# Patient Record
Sex: Male | Born: 1935 | Race: White | Hispanic: No | Marital: Married | Smoking: Never smoker
Health system: Southern US, Community
[De-identification: ages and names within clinical notes are randomized; demographics above are authoritative.]

## PROBLEM LIST (undated history)

## (undated) DIAGNOSIS — I4891 Unspecified atrial fibrillation: Secondary | ICD-10-CM

## (undated) DIAGNOSIS — E119 Type 2 diabetes mellitus without complications: Secondary | ICD-10-CM

## (undated) DIAGNOSIS — R0602 Shortness of breath: Secondary | ICD-10-CM

## (undated) DIAGNOSIS — I10 Essential (primary) hypertension: Secondary | ICD-10-CM

## (undated) DIAGNOSIS — I2510 Atherosclerotic heart disease of native coronary artery without angina pectoris: Secondary | ICD-10-CM

## (undated) DIAGNOSIS — N289 Disorder of kidney and ureter, unspecified: Secondary | ICD-10-CM

## (undated) DIAGNOSIS — I499 Cardiac arrhythmia, unspecified: Secondary | ICD-10-CM

## (undated) DIAGNOSIS — E785 Hyperlipidemia, unspecified: Secondary | ICD-10-CM

## (undated) DIAGNOSIS — M1990 Unspecified osteoarthritis, unspecified site: Secondary | ICD-10-CM

## (undated) DIAGNOSIS — I209 Angina pectoris, unspecified: Secondary | ICD-10-CM

## (undated) DIAGNOSIS — I214 Non-ST elevation (NSTEMI) myocardial infarction: Secondary | ICD-10-CM

## (undated) DIAGNOSIS — I739 Peripheral vascular disease, unspecified: Secondary | ICD-10-CM

## (undated) DIAGNOSIS — K219 Gastro-esophageal reflux disease without esophagitis: Secondary | ICD-10-CM

## (undated) DIAGNOSIS — G546 Phantom limb syndrome with pain: Secondary | ICD-10-CM

## (undated) DIAGNOSIS — E8809 Other disorders of plasma-protein metabolism, not elsewhere classified: Secondary | ICD-10-CM

## (undated) DIAGNOSIS — E669 Obesity, unspecified: Secondary | ICD-10-CM

## (undated) DIAGNOSIS — Z9289 Personal history of other medical treatment: Secondary | ICD-10-CM

## (undated) HISTORY — DX: Cardiac arrhythmia, unspecified: I49.9

## (undated) HISTORY — PX: INGUINAL HERNIA REPAIR: SUR1180

## (undated) HISTORY — DX: Atherosclerotic heart disease of native coronary artery without angina pectoris: I25.10

## (undated) HISTORY — DX: Essential (primary) hypertension: I10

## (undated) HISTORY — DX: Obesity, unspecified: E66.9

## (undated) HISTORY — DX: Hyperlipidemia, unspecified: E78.5

## (undated) HISTORY — DX: Other disorders of plasma-protein metabolism, not elsewhere classified: E88.09

## (undated) HISTORY — DX: Disorder of kidney and ureter, unspecified: N28.9

## (undated) HISTORY — PX: JOINT REPLACEMENT: SHX530

## (undated) HISTORY — DX: Peripheral vascular disease, unspecified: I73.9

## (undated) HISTORY — PX: APPENDECTOMY: SHX54

## (undated) HISTORY — PX: CORONARY ANGIOPLASTY: SHX604

## (undated) HISTORY — PX: VASCULAR SURGERY: SHX849

## (undated) HISTORY — DX: Phantom limb syndrome with pain: G54.6

## (undated) HISTORY — PX: CHOLECYSTECTOMY: SHX55

---

## 2000-09-07 ENCOUNTER — Encounter: Admission: RE | Admit: 2000-09-07 | Discharge: 2000-09-07 | Payer: Self-pay | Admitting: Internal Medicine

## 2007-02-14 DIAGNOSIS — I214 Non-ST elevation (NSTEMI) myocardial infarction: Secondary | ICD-10-CM

## 2007-02-14 HISTORY — DX: Non-ST elevation (NSTEMI) myocardial infarction: I21.4

## 2007-03-04 ENCOUNTER — Ambulatory Visit: Payer: Self-pay | Admitting: Cardiology

## 2007-03-04 ENCOUNTER — Inpatient Hospital Stay (HOSPITAL_COMMUNITY): Admission: EM | Admit: 2007-03-04 | Discharge: 2007-03-13 | Payer: Self-pay | Admitting: Cardiovascular Disease

## 2007-03-05 ENCOUNTER — Encounter: Payer: Self-pay | Admitting: Cardiovascular Disease

## 2007-03-06 ENCOUNTER — Encounter: Payer: Self-pay | Admitting: Cardiovascular Disease

## 2007-03-08 ENCOUNTER — Encounter: Payer: Self-pay | Admitting: Cardiothoracic Surgery

## 2007-03-09 ENCOUNTER — Ambulatory Visit: Payer: Self-pay | Admitting: Cardiothoracic Surgery

## 2007-03-10 HISTORY — PX: CORONARY ARTERY BYPASS GRAFT: SHX141

## 2007-04-01 ENCOUNTER — Encounter: Admission: RE | Admit: 2007-04-01 | Discharge: 2007-04-01 | Payer: Self-pay | Admitting: Cardiothoracic Surgery

## 2007-04-01 ENCOUNTER — Ambulatory Visit: Payer: Self-pay | Admitting: Cardiothoracic Surgery

## 2007-04-26 ENCOUNTER — Ambulatory Visit: Payer: Self-pay | Admitting: Cardiology

## 2007-04-29 ENCOUNTER — Encounter: Admission: RE | Admit: 2007-04-29 | Discharge: 2007-04-29 | Payer: Self-pay | Admitting: Cardiothoracic Surgery

## 2007-04-29 ENCOUNTER — Ambulatory Visit: Payer: Self-pay | Admitting: Cardiothoracic Surgery

## 2007-04-30 ENCOUNTER — Ambulatory Visit: Payer: Self-pay | Admitting: Cardiothoracic Surgery

## 2007-05-05 ENCOUNTER — Encounter: Payer: Self-pay | Admitting: Cardiology

## 2007-05-13 ENCOUNTER — Encounter: Payer: Self-pay | Admitting: Cardiology

## 2007-06-18 ENCOUNTER — Ambulatory Visit: Payer: Self-pay | Admitting: Cardiology

## 2007-06-22 ENCOUNTER — Encounter: Payer: Self-pay | Admitting: Cardiology

## 2007-07-01 ENCOUNTER — Ambulatory Visit: Payer: Self-pay | Admitting: Cardiothoracic Surgery

## 2007-07-01 ENCOUNTER — Encounter: Admission: RE | Admit: 2007-07-01 | Discharge: 2007-07-01 | Payer: Self-pay | Admitting: Cardiothoracic Surgery

## 2007-07-07 ENCOUNTER — Encounter: Payer: Self-pay | Admitting: Cardiology

## 2007-07-09 ENCOUNTER — Ambulatory Visit: Payer: Self-pay | Admitting: Cardiology

## 2007-07-30 ENCOUNTER — Ambulatory Visit: Payer: Self-pay

## 2007-08-02 ENCOUNTER — Ambulatory Visit: Payer: Self-pay | Admitting: Cardiovascular Disease

## 2007-12-16 ENCOUNTER — Encounter: Admission: RE | Admit: 2007-12-16 | Discharge: 2007-12-16 | Payer: Self-pay | Admitting: Cardiothoracic Surgery

## 2007-12-16 ENCOUNTER — Ambulatory Visit: Payer: Self-pay | Admitting: Cardiothoracic Surgery

## 2007-12-30 ENCOUNTER — Ambulatory Visit: Payer: Self-pay | Admitting: *Deleted

## 2008-01-21 ENCOUNTER — Ambulatory Visit: Payer: Self-pay | Admitting: *Deleted

## 2008-01-21 ENCOUNTER — Ambulatory Visit (HOSPITAL_COMMUNITY): Admission: RE | Admit: 2008-01-21 | Discharge: 2008-01-21 | Payer: Self-pay | Admitting: *Deleted

## 2008-01-21 ENCOUNTER — Encounter: Payer: Self-pay | Admitting: Cardiology

## 2008-01-27 ENCOUNTER — Ambulatory Visit: Payer: Self-pay | Admitting: *Deleted

## 2008-02-02 ENCOUNTER — Encounter: Payer: Self-pay | Admitting: Cardiology

## 2008-02-04 ENCOUNTER — Inpatient Hospital Stay (HOSPITAL_COMMUNITY): Admission: RE | Admit: 2008-02-04 | Discharge: 2008-02-07 | Payer: Self-pay | Admitting: *Deleted

## 2008-02-04 ENCOUNTER — Encounter (INDEPENDENT_AMBULATORY_CARE_PROVIDER_SITE_OTHER): Payer: Self-pay | Admitting: *Deleted

## 2008-02-04 HISTORY — PX: FEMORAL BYPASS: SHX50

## 2008-02-16 ENCOUNTER — Ambulatory Visit: Payer: Self-pay | Admitting: Cardiology

## 2008-02-16 DIAGNOSIS — I4821 Permanent atrial fibrillation: Secondary | ICD-10-CM

## 2008-02-16 DIAGNOSIS — I739 Peripheral vascular disease, unspecified: Secondary | ICD-10-CM

## 2008-02-16 DIAGNOSIS — E119 Type 2 diabetes mellitus without complications: Secondary | ICD-10-CM

## 2008-02-16 DIAGNOSIS — I25810 Atherosclerosis of coronary artery bypass graft(s) without angina pectoris: Secondary | ICD-10-CM | POA: Insufficient documentation

## 2008-02-16 DIAGNOSIS — I10 Essential (primary) hypertension: Secondary | ICD-10-CM

## 2008-02-19 ENCOUNTER — Encounter: Payer: Self-pay | Admitting: Cardiology

## 2008-02-24 ENCOUNTER — Ambulatory Visit: Payer: Self-pay | Admitting: *Deleted

## 2008-03-23 ENCOUNTER — Encounter: Payer: Self-pay | Admitting: Cardiology

## 2008-04-21 ENCOUNTER — Ambulatory Visit: Payer: Self-pay | Admitting: *Deleted

## 2008-06-22 ENCOUNTER — Encounter: Admission: RE | Admit: 2008-06-22 | Discharge: 2008-06-22 | Payer: Self-pay | Admitting: Cardiothoracic Surgery

## 2008-06-22 ENCOUNTER — Ambulatory Visit: Payer: Self-pay | Admitting: Cardiothoracic Surgery

## 2008-06-22 ENCOUNTER — Encounter: Payer: Self-pay | Admitting: Internal Medicine

## 2008-07-21 ENCOUNTER — Encounter (INDEPENDENT_AMBULATORY_CARE_PROVIDER_SITE_OTHER): Payer: Self-pay | Admitting: *Deleted

## 2008-07-27 ENCOUNTER — Ambulatory Visit: Payer: Self-pay | Admitting: *Deleted

## 2008-08-18 ENCOUNTER — Ambulatory Visit: Payer: Self-pay | Admitting: Vascular Surgery

## 2008-09-12 DIAGNOSIS — E785 Hyperlipidemia, unspecified: Secondary | ICD-10-CM

## 2008-09-12 DIAGNOSIS — Z9889 Other specified postprocedural states: Secondary | ICD-10-CM | POA: Insufficient documentation

## 2008-09-12 DIAGNOSIS — N259 Disorder resulting from impaired renal tubular function, unspecified: Secondary | ICD-10-CM | POA: Insufficient documentation

## 2008-09-12 DIAGNOSIS — Z9089 Acquired absence of other organs: Secondary | ICD-10-CM

## 2008-09-12 DIAGNOSIS — Z951 Presence of aortocoronary bypass graft: Secondary | ICD-10-CM

## 2008-09-20 ENCOUNTER — Ambulatory Visit: Payer: Self-pay | Admitting: Cardiology

## 2008-09-22 ENCOUNTER — Ambulatory Visit: Payer: Self-pay | Admitting: Vascular Surgery

## 2008-09-22 ENCOUNTER — Encounter: Payer: Self-pay | Admitting: Cardiology

## 2008-10-11 IMAGING — CR DG CHEST 2V
2 series · 2 of 2 positions shown · non-contrast
Comparison: [REDACTED] chest x-ray 03/11/07 and 03/07/07, and [REDACTED], 03/07/07.

CLINICAL DATA: Post CABG, 03/08/07.  Cough.  Shortness of breath. 
 DIAGNOSTIC CHEST - 2 VIEW:

[w chest pa]
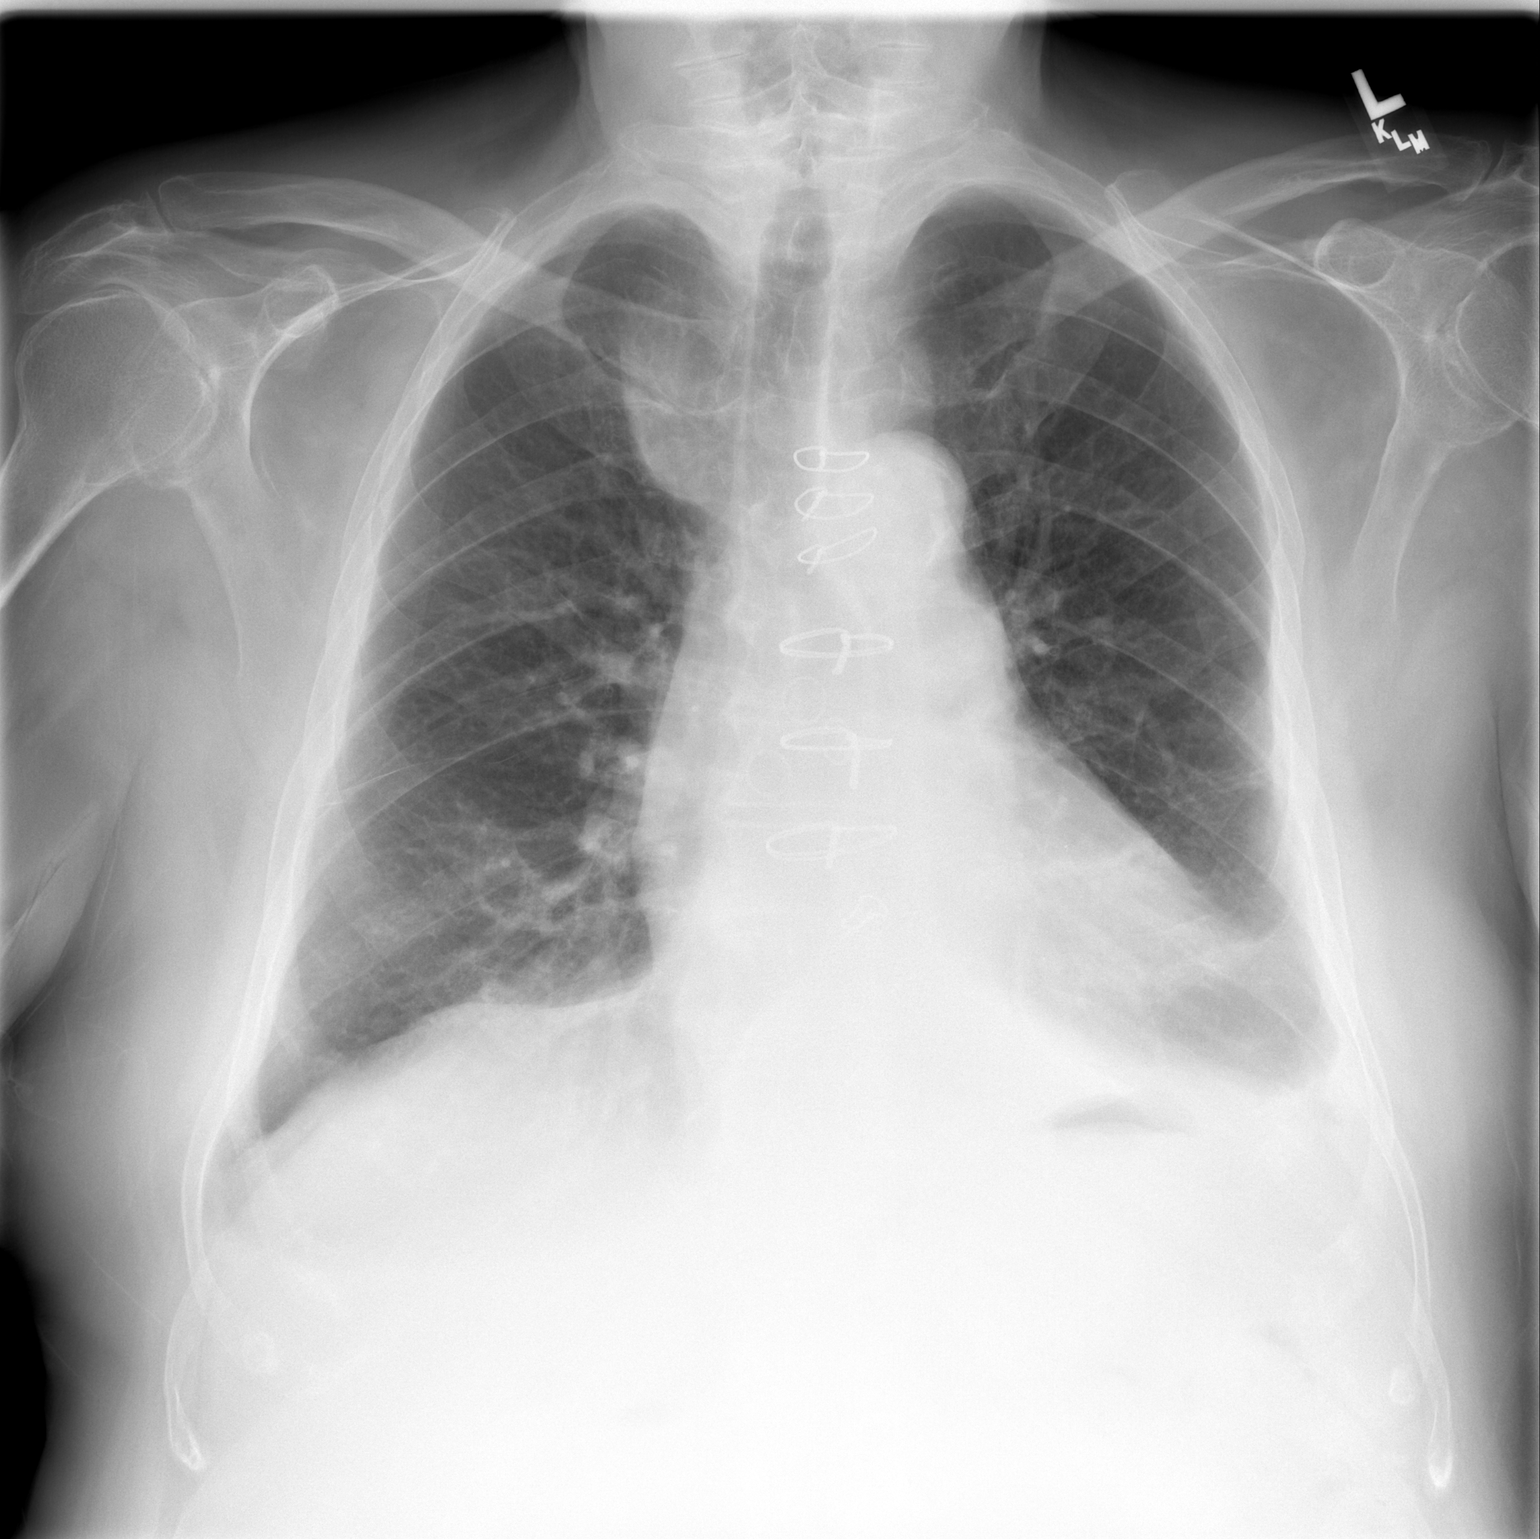

[w chest lat]
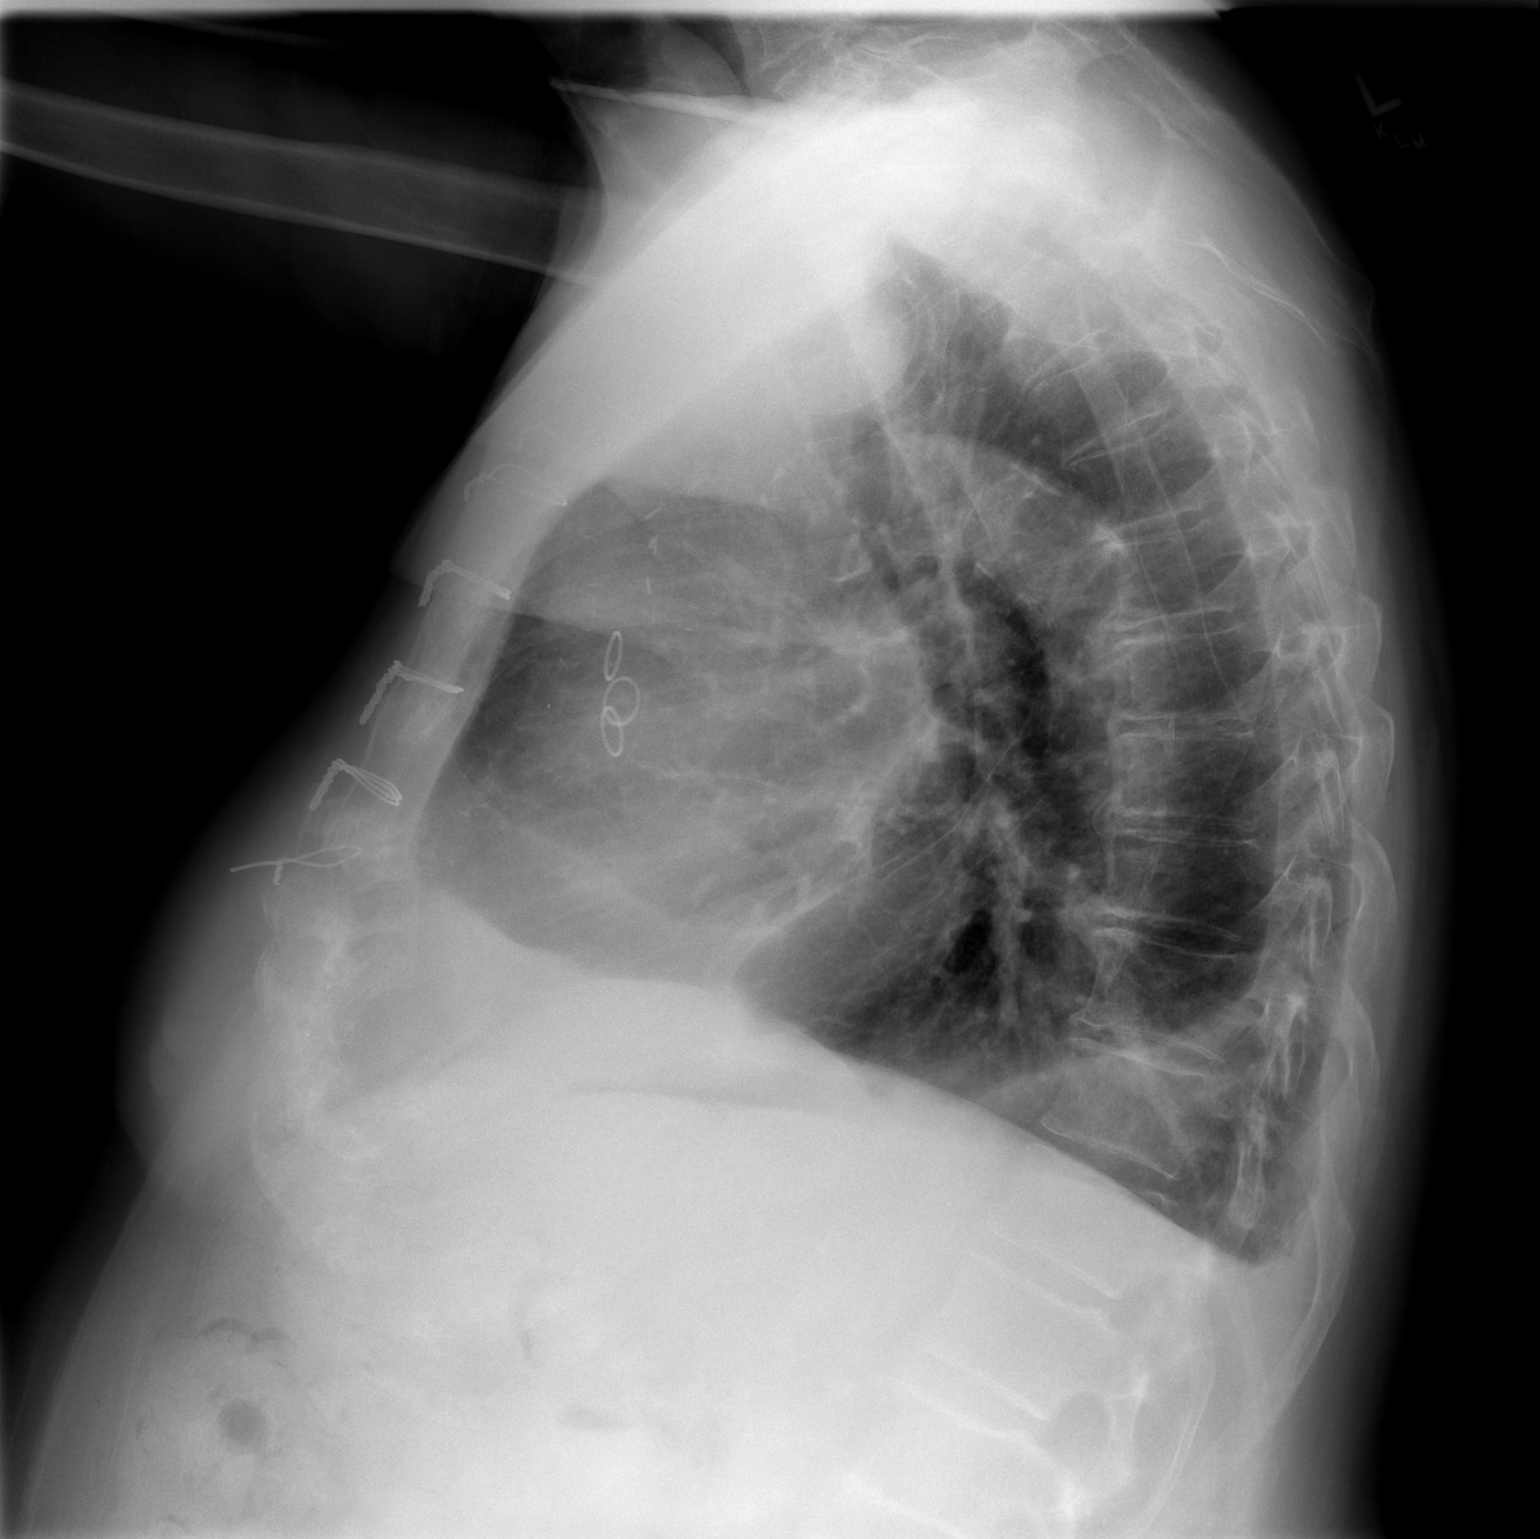

[2 of 2 positions shown; findings below may reference images not displayed]

FINDINGS: Stable right superior mediastinal shadow corresponds to dolichoectasia brachiocephalic artery.  Stable slight cardiomegaly.  Post CABG change is seen.  Persistent slightly smaller (left greater than right) small pleural effusions are seen.  Chronic linear scarring or atelectasis is seen at the left lower lobe.  The lungs are otherwise clear.  No new abnormality is seen.
IMPRESSION: 1.  Smaller yet persistent (left greater than right) small pleural effusion.
 2.  Slight cardiomegaly post CABG.
 3.  Dolichoectasia right brachiocephalic artery. 
 4.  No new abnormality seen.

## 2009-03-28 ENCOUNTER — Ambulatory Visit: Payer: Self-pay | Admitting: Cardiology

## 2009-03-28 DIAGNOSIS — S2329XA Dislocation of other parts of thorax, initial encounter: Secondary | ICD-10-CM | POA: Insufficient documentation

## 2009-04-23 ENCOUNTER — Encounter (INDEPENDENT_AMBULATORY_CARE_PROVIDER_SITE_OTHER): Payer: Self-pay | Admitting: *Deleted

## 2009-05-29 ENCOUNTER — Telehealth (INDEPENDENT_AMBULATORY_CARE_PROVIDER_SITE_OTHER): Payer: Self-pay | Admitting: *Deleted

## 2009-05-29 ENCOUNTER — Encounter: Payer: Self-pay | Admitting: Cardiology

## 2009-06-06 ENCOUNTER — Encounter: Payer: Self-pay | Admitting: Cardiology

## 2009-06-26 ENCOUNTER — Encounter: Payer: Self-pay | Admitting: Cardiology

## 2009-07-01 ENCOUNTER — Ambulatory Visit: Payer: Self-pay | Admitting: Cardiology

## 2009-07-02 ENCOUNTER — Encounter: Payer: Self-pay | Admitting: Cardiology

## 2009-07-03 ENCOUNTER — Encounter: Payer: Self-pay | Admitting: Cardiology

## 2009-07-04 ENCOUNTER — Encounter: Payer: Self-pay | Admitting: Cardiology

## 2009-07-05 ENCOUNTER — Encounter: Payer: Self-pay | Admitting: Cardiology

## 2009-07-11 ENCOUNTER — Encounter: Payer: Self-pay | Admitting: Cardiology

## 2009-07-19 ENCOUNTER — Encounter: Payer: Self-pay | Admitting: Cardiology

## 2009-08-03 ENCOUNTER — Encounter: Payer: Self-pay | Admitting: Cardiology

## 2009-08-03 ENCOUNTER — Ambulatory Visit: Payer: Self-pay | Admitting: Vascular Surgery

## 2009-08-08 ENCOUNTER — Ambulatory Visit: Payer: Self-pay | Admitting: Cardiology

## 2009-08-08 DIAGNOSIS — R0602 Shortness of breath: Secondary | ICD-10-CM

## 2009-08-10 ENCOUNTER — Ambulatory Visit: Payer: Self-pay | Admitting: Cardiology

## 2009-08-14 ENCOUNTER — Ambulatory Visit: Payer: Self-pay | Admitting: Cardiology

## 2009-08-14 LAB — CONVERTED CEMR LAB: POC INR: 3.2

## 2009-08-15 ENCOUNTER — Encounter: Payer: Self-pay | Admitting: Cardiology

## 2009-08-24 ENCOUNTER — Encounter: Payer: Self-pay | Admitting: Cardiology

## 2009-08-24 ENCOUNTER — Ambulatory Visit: Payer: Self-pay | Admitting: Cardiology

## 2009-08-24 LAB — CONVERTED CEMR LAB
INR: 8.1
POC INR: >8
Prothrombin Time: 70.1 s

## 2009-08-28 ENCOUNTER — Ambulatory Visit: Payer: Self-pay | Admitting: Cardiology

## 2009-08-28 LAB — CONVERTED CEMR LAB: POC INR: 2.6

## 2009-08-31 ENCOUNTER — Ambulatory Visit: Payer: Self-pay | Admitting: Cardiology

## 2009-08-31 LAB — CONVERTED CEMR LAB: POC INR: 1.8

## 2009-09-04 ENCOUNTER — Ambulatory Visit: Payer: Self-pay | Admitting: Cardiology

## 2009-09-12 ENCOUNTER — Encounter: Payer: Self-pay | Admitting: Cardiology

## 2009-09-13 ENCOUNTER — Encounter: Payer: Self-pay | Admitting: Cardiology

## 2009-09-14 ENCOUNTER — Ambulatory Visit: Payer: Self-pay | Admitting: Cardiology

## 2009-09-25 ENCOUNTER — Telehealth (INDEPENDENT_AMBULATORY_CARE_PROVIDER_SITE_OTHER): Payer: Self-pay | Admitting: *Deleted

## 2009-10-05 ENCOUNTER — Ambulatory Visit: Payer: Self-pay | Admitting: Cardiology

## 2009-10-05 LAB — CONVERTED CEMR LAB: POC INR: 1.7

## 2010-01-13 HISTORY — PX: TOTAL HIP ARTHROPLASTY: SHX124

## 2010-02-12 NOTE — Letter (Signed)
Summary: [facility] D/C DR. DHRUV VYAS  [facility] D/C DR. DHRUV VYAS   Imported By: Zachary George 08/08/2009 13:14:07  _____________________________________________________________________  External Attachment:    Type:   Image     Comment:   External Document

## 2010-02-12 NOTE — Medication Information (Signed)
Summary: ccr-lr  Anticoagulant Therapy  Managed by: Vashti Hey, RN PCP: Dr. Doreen Beam Supervising MD: Andee Lineman MD, Michelle Piper Indication 1: Atrial Fibrillation Lab Used: [facility] [place] Site: [facility] INR POC 1.8  Dietary changes: no    Health status changes: no    Bleeding/hemorrhagic complications: no    Recent/future hospitalizations: no    Any changes in medication regimen? no    Recent/future dental: no  Any missed doses?: no       Is patient compliant with meds? yes       Allergies: 1)  ! Ace Inhibitors 2)  ! * Arb  Anticoagulation Management History:      The patient is taking warfarin and comes in today for a routine follow up visit.  He is being anticoagulated due to Atrial Fibrillation .  Positive risk factors for bleeding include an age of 31 years or older, history of CVA/TIA, and presence of serious comorbidities.  Negative risk factors for bleeding include no history of GI bleeding.  The bleeding index is 'high risk'.  Positive CHADS2 values include History of HTN, Age > 75 years old, History of Diabetes, and Prior Stroke/CVA/TIA.  Negative CHADS2 values include History of CHF.  The start date was 08/08/2009.  His last INR was 8.1.  Anticoagulation responsible provider: Andee Lineman MD, Michelle Piper.  INR POC: 1.8.  Cuvette Lot#: [id number].    Anticoagulation Management Assessment/Plan:      The patient's current anticoagulation dose is Coumadin 2.5 mg tabs: take one tab every evening per coumadin clinic.  The target INR is 2.0-3.0.  The next INR is due 09/04/2009.  Anticoagulation instructions were given to patient.  Results were reviewed/authorized by Vashti Hey, RN.  He was notified by Vashti Hey RN.         Prior Anticoagulation Instructions: INR 2.6 Take 1.25mg  x 3 days then recheck INR on 08/31/09   Current Anticoagulation Instructions: INR 1.8 Take coumadin 2.5mg  tonight then resume 1.25mg  once daily

## 2010-02-12 NOTE — Letter (Signed)
Summary: Engineer, materials at [facility]  [address]   [place], [place] [address]   Phone: [phone]  Fax: [phone]        April 23, 2009 MRN: [id number]   [name] Love [address] [place], [place]  [address]   Dear [name],  Your test ordered by Selena Batten has been reviewed by your physician (or physician assistant) and was found to be normal or stable. Your physician (or physician assistant) felt no changes were needed at this time.  ____ Echocardiogram  ____ Cardiac Stress Test  ____ Lab Work  ____ Peripheral vascular study of arms, legs or neck  __X__ CT scan or X-ray (chest) No change from prior x-rays.  ____ Lung or Breathing test  ____ Other:   Thank you.   Hoover Brunette, LPN    Duane Boston, M.D., F.A.C.C. Thressa Sheller, M.D., F.A.C.C. Oneal Grout, M.D., F.A.C.C. Cheree Ditto, M.D., F.A.C.C. Daiva Nakayama, M.D., F.A.C.C. Kenney Houseman, M.D., F.A.C.C. Jeanne Ivan, PA-C

## 2010-02-12 NOTE — Medication Information (Signed)
Summary: ccr-lr  Anticoagulant Therapy  Managed by: Vashti Hey, RN PCP: Dr. Doreen Beam Supervising MD: Myrtis Ser MD, Tinnie Gens Indication 1: Atrial Fibrillation Lab Used: [facility] [place] Site: [facility] PT 70.1 INR POC >8.0  Dietary changes: no    Health status changes: yes       Details: States being sent to kidney MD  Bleeding/hemorrhagic complications: no    Recent/future hospitalizations: no    Any changes in medication regimen? yes       Details: Finshed ABX for toe yesterday     Recent/future dental: no  Any missed doses?: no       Is patient compliant with meds? yes       Allergies: 1)  ! Ace Inhibitors 2)  ! * Arb  Anticoagulation Management History:      The patient is taking warfarin and comes in today for a routine follow up visit.  He is being anticoagulated due to Atrial Fibrillation .  Positive risk factors for bleeding include an age of 79 years or older, history of CVA/TIA, and presence of serious comorbidities.  Negative risk factors for bleeding include no history of GI bleeding.  The bleeding index is 'high risk'.  Positive CHADS2 values include History of HTN, Age > 73 years old, History of Diabetes, and Prior Stroke/CVA/TIA.  Negative CHADS2 values include History of CHF.  The start date was 08/08/2009.  Today's INR is 8.1.  Prothrombin time is 70.1.  Anticoagulation responsible provider: Myrtis Ser MD, Tinnie Gens.  INR POC: >8.0.  Cuvette Lot#: [id number].    Anticoagulation Management Assessment/Plan:      The patient's current anticoagulation dose is Coumadin 2.5 mg tabs: take one tab every evening per coumadin clinic.  The target INR is 2.0-3.0.  The next INR is due 08/28/2009.  Anticoagulation instructions were given to patient.  Results were reviewed/authorized by Vashti Hey, RN.  He was notified by Vashti Hey RN.         Prior Anticoagulation Instructions: INR 3.2 Hold coumadin tonight then take 1 tablet once daily   Current Anticoagulation  Instructions: INR POC > 8.0 Sent to [facility] veinap[facility]  PT 70.1  INR 8.1 Discussed with Weston Brass PharmD Spoke with wife.  Pt to hold coumadin and recheck INR on 08/28/09 Bleeding precautions discussed with wife.  PT to go to ED for any S/S of bleeding.

## 2010-02-12 NOTE — Letter (Signed)
Summary: External Correspondence/ FAXED [facility]  External Correspondence/ FAXED [facility]   Imported By: Dorise Hiss 08/31/2009 10:28:58  _____________________________________________________________________  External Attachment:    Type:   Image     Comment:   External Document

## 2010-02-12 NOTE — Assessment & Plan Note (Signed)
Summary: 6 mo fu per march reminders-srs   Visit Type:  Follow-up Primary Provider:  Dr. Doreen Beam  CC:  follow-up visit.  History of Present Illness: the patient is a 77 year old male with history of coronary artery disease, status post coronary bypass grafting as well as peripheral vascular disease. The patient is status post right femoral posterior tibial artery vascular surgery.the patient has had bypass graft occlusion. This is followed by Dr. Karlene Lineman. The patient has no resting claudication. He has no critical limb ischemia. If anything his symptoms of right leg pain had improved. This  The patient denies any chest pain shortness of breath or copy or PND. He reports no palpitations. He did point to the fact that his sternum appear somewhat loose but prior sternotomy site. He reports no pain or shortness of breath related to this.  the patient walks on a regular basis. He denies any presyncope or syncope.  Preventive Screening-Counseling & Management  Alcohol-Tobacco     Smoking Status: never  Clinical Review Panels:  Cardiac Imaging Cardiac Cath Findings well-preserved left ventricle function Patent internal mammary artery. Diffuse calcified disease of the LAD over a long area #1 circumflex with high-grade first marginal disease and moderate AV circumflex disease Nondominant right coronary artery with 60-70% narrowing. (03/05/2007)    Current Medications (verified): 1)  Glucophage 500 Mg Tabs (Metformin Hcl) .... Take 1 Tablet By Mouth Three Times A Day 2)  Metoprolol Succinate 100 Mg Xr24h-Tab (Metoprolol Succinate) .... One Tablet P.o. Q. Day 3)  Aspirin 81 Mg Tbec (Aspirin) .... Take One Tablet By Mouth Two Times A Day 4)  Simvastatin 40 Mg Tabs (Simvastatin) .... Take 1 Tab By Mouth At Bedtime 5)  Clonidine Hcl 0.2 Mg Tabs (Clonidine Hcl) .... Take 1 Tablet By Mouth Twice A Day 6)  Fish Oil 1000 Mg Caps (Omega-3 Fatty Acids) .... Take 1 Capsule By Mouth Two Times A  Day 7)  Plavix 75 Mg Tabs (Clopidogrel Bisulfate) .... Take One Tablet By Mouth Daily  Allergies: 1)  ! Ace Inhibitors 2)  ! * Arb  Comments:  Nurse/Medical Assistant: The patient's medications were reviewed with the patient and were updated in the Medication List. Pt brought a list of medications to office visit.  Cyril Loosen, RN, BSN (March 28, 2009 9:27 AM)  Past History:  Past Medical History: Last updated: 09/20/2008  1. Multivessel coronary artery disease.       a.     Status post non-ST-segment myocardial infarction/four-vessel        coronary artery bypass graft, February 2009.       b.     Normal left ventricular function.       c.     Postop paroxysmal atrial fibrillation, treated with        amiodarone.   2. Peripheral vascular disease.       a.     Preoperative ankle-brachial indices:  0.66 right; 0.6 left.       b.     Right lower extremity claudication.       c.     40-60% left internal carotid artery stenosis.   3. Dyslipidemia.   4. Type 2 diabetes mellitus.   5. Hypertension.   6. Renal insufficiency.   7. Hypoalbuminemia.   8. Obesity. Lungs pending essential hypertension  Nephrolithiasis Inguinal hernia repair Dyslipidemia ATRIAL FIBRILLATION, HX OF (ICD-V12.59) ESSENTIAL HYPERTENSION, BENIGN (ICD-401.1) AODM (ICD-250.00) PVD (ICD-443.9) CORONARY ATHEROSCLEROSIS, ARTERY BYPASS GRAFT (ICD-414.04)    Past Surgical  History: Last updated: 09/12/2008 CORONARY ARTERY BYPASS GRAFT, HX OF (ICD-V45.81) INGUINAL HERNIORRHAPHY, HX OF (ICD-V45.89) CHOLECYSTECTOMY, HX OF (ICD-V45.79) APPENDECTOMY, HX OF (ICD-V45.79) PVD" Hayes right femto PT bypass with gortex 02/04/2008 occluded by doppler 08/2008  Family History: Last updated: 02/16/2008 there is no peripheral arterial disease or stroke in the family  Social History: Last updated: 02/16/2008 the patient is married. He is to grandchildren. 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 He does not smoke cigarettes or drink alcohol. He exercises regularly.  Risk Factors: Smoking Status: never (03/28/2009)  Review of Systems  The patient denies fatigue, malaise, fever, weight gain/loss, vision loss, decreased hearing, hoarseness, chest pain, palpitations, shortness of breath, prolonged cough, wheezing, sleep apnea, coughing up blood, abdominal pain, blood in stool, nausea, vomiting, diarrhea, heartburn, incontinence, blood in urine, muscle weakness, joint pain, leg swelling, rash, skin lesions, headache, fainting, dizziness, depression, anxiety, enlarged lymph nodes, easy bruising or bleeding, and environmental allergies.    Vital Signs:  Patient profile:   77 year old male Height:      73 inches Weight:      203.50 pounds Pulse rate:   69 / minute BP sitting:   183 / 82  (right arm) Cuff size:   regular  Vitals Entered By: Cyril Loosen, RN, BSN (March 28, 2009 9:23 AM) CC: follow-up visit Comments No cardiac complaints   Physical Exam  Additional Exam:  General: Well-developed, well-nourished in no distress head: Normocephalic and atraumatic eyes PERRLA/EOMI intact, conjunctiva and lids normal nose: No deformity or lesions mouth normal dentition, normal posterior pharynx neck: Supple, no JVD.  No masses, thyromegaly or abnormal cervical nodes lungs: Normal breath sounds bilaterally without wheezing.  Normal percussion Chest: There is a separation of the sternum, without a click on palpation but with independent motion of both sides of the sternum. heart: regular rate and rhythm with normal S1 and S2, no S3 or S4.  PMI is normal.  No pathological murmurs abdomen: Normal bowel sounds, abdomen is soft and nontender without masses, organomegaly or hernias noted.  No hepatosplenomegaly musculoskeletal: Back normal, normal gait muscle strength and tone normal pulsus: unable to palpate pulses in the right leg including dorsalis pedis and  posterior tibial pulse. Extremities: No peripheral pitting edema neurologic: Alert and oriented x 3 skin: Intact without lesions or rashes cervical nodes: No significant adenopathy psychologic: Normal affect    Impression & Recommendations:  Problem # 1:  CLOSED DISLOCATION, STERNUM (ICD-839.61) the patient's sternum is unstable at the prior sternotomy site. I ordered a chest x-ray. I will further discuss this with CVTS after review of the chest x-ray. Orders: T-Chest x-ray, 2 views (71020)  Problem # 2:  CORONARY ARTERY BYPASS GRAFT, HX OF (ICD-V45.81) the patient reports no chest pain. The patient status post bypass grafting in 2009.  Problem # 3:  PVD (ICD-443.9) the patient's significant peripheral vascular disease with occlusion of the graft to his right leg.Dr. early is no planning on any further intervention. The patient was encouraged to increase exercise. His claudication is actually improved.  Problem # 4:  ESSENTIAL HYPERTENSION, BENIGN (ICD-401.1) Assessment: Comment Only  His updated medication list for this problem includes:    Metoprolol Succinate 100 Mg Xr24h-tab (Metoprolol succinate) ..... One tablet p.o. q. day    Aspirin 81 Mg Tbec (Aspirin) .Marland Kitchen... Take one tablet by mouth two times a day    Clonidine Hcl 0.2 Mg Tabs (Clonidine hcl) .Marland Kitchen... Take  1 tablet by mouth twice a day  Other Orders: EKG w/ Interpretation (93000)  Patient Instructions: 1)  chest x-ray 2)  Follow up in  1 year.

## 2010-02-12 NOTE — Progress Notes (Signed)
Summary: MEDICINE QUESTION  Phone Note Call from Patient Call back at Home Phone [phone]   Caller: PATIENT WIFE Reason for Call: Talk to Nurse Summary of Call: PATIENT DID NOT TAKE CORRECT AMOUNT LAST NIGHT.  NEEDS TO KNOW WHAT SHE NEEDS TO DO Initial call taken by: Claudette Laws,  September 25, 2009 9:18 AM  Follow-up for Phone Call        Pt has been taking coumadin 1.25mg  once daily.  He missed last nights dose.  Told wife to have pt take 2.5mg  tonight then resume 1.25mg  once daily.  She verbalized understanding.

## 2010-02-12 NOTE — Medication Information (Signed)
Summary: ccr-lr  Anticoagulant Therapy  Managed by: Vashti Hey, RN PCP: Dr. Doreen Beam Supervising MD: Diona Browner MD, Remi Deter Indication 1: Atrial Fibrillation Lab Used: [facility] [place] Site: [facility] INR POC 2.5  Dietary changes: no    Health status changes: no    Bleeding/hemorrhagic complications: no    Recent/future hospitalizations: no    Any changes in medication regimen? no    Recent/future dental: no  Any missed doses?: no       Is patient compliant with meds? yes       Allergies: 1)  ! Ace Inhibitors 2)  ! * Arb  Anticoagulation Management History:      The patient is taking warfarin and comes in today for a routine follow up visit.  He is being anticoagulated due to Atrial Fibrillation .  Positive risk factors for bleeding include an age of 45 years or older, history of CVA/TIA, and presence of serious comorbidities.  Negative risk factors for bleeding include no history of GI bleeding.  The bleeding index is 'high risk'.  Positive CHADS2 values include History of HTN, Age > 58 years old, History of Diabetes, and Prior Stroke/CVA/TIA.  Negative CHADS2 values include History of CHF.  The start date was 08/08/2009.  His last INR was 8.1.  Anticoagulation responsible provider: Diona Browner MD, Remi Deter.  INR POC: 2.5.  Cuvette Lot#: [id number].    Anticoagulation Management Assessment/Plan:      The patient's current anticoagulation dose is Coumadin 2.5 mg tabs: take one tab every evening per coumadin clinic.  The target INR is 2.0-3.0.  The next INR is due 10/05/2009.  Anticoagulation instructions were given to patient.  Results were reviewed/authorized by Vashti Hey, RN.  He was notified by Vashti Hey RN.         Prior Anticoagulation Instructions: INR 2.6 Continue coumadin 1.25mg  once daily   Current Anticoagulation Instructions: INR 2.5 Continue coumadin 1.25mg  once daily

## 2010-02-12 NOTE — Letter (Signed)
Summary: External Correspondence/ OFFICE VISIT [facility]  External Correspondence/ [facility]ICE [facility]   Imported By: Dorise Hiss 07/03/2009 11:50:00  _____________________________________________________________________  External Attachment:    Type:   Image     Comment:   External Document

## 2010-02-12 NOTE — Medication Information (Signed)
Summary: ccr-lr  Anticoagulant Therapy  Managed by: Vashti Hey, RN PCP: Dr. Doreen Beam Supervising MD: Antoine Poche MD, Fayrene Fearing Indication 1: Atrial Fibrillation Lab Used: [facility] [place] Site: [facility] INR POC 2.6  Dietary changes: no    Health status changes: no    Bleeding/hemorrhagic complications: no    Recent/future hospitalizations: no    Any changes in medication regimen? no    Recent/future dental: no  Any missed doses?: yes     Details: coumadin has been on hold since 08/24/09  Is patient compliant with meds? yes       Allergies: 1)  ! Ace Inhibitors 2)  ! * Arb  Anticoagulation Management History:      The patient is taking warfarin and comes in today for a routine follow up visit.  He is being anticoagulated due to Atrial Fibrillation .  Positive risk factors for bleeding include an age of 35 years or older, history of CVA/TIA, and presence of serious comorbidities.  Negative risk factors for bleeding include no history of GI bleeding.  The bleeding index is 'high risk'.  Positive CHADS2 values include History of HTN, Age > 28 years old, History of Diabetes, and Prior Stroke/CVA/TIA.  Negative CHADS2 values include History of CHF.  The start date was 08/08/2009.  His last INR was 8.1.  Anticoagulation responsible provider: Antoine Poche MD, Fayrene Fearing.  INR POC: 2.6.  Cuvette Lot#: [id number].    Anticoagulation Management Assessment/Plan:      The patient's current anticoagulation dose is Coumadin 2.5 mg tabs: take one tab every evening per coumadin clinic.  The target INR is 2.0-3.0.  The next INR is due 08/31/2009.  Anticoagulation instructions were given to patient.  Results were reviewed/authorized by Vashti Hey, RN.  He was notified by Vashti Hey RN.         Prior Anticoagulation Instructions: INR POC > 8.0 Sent to [facility] veinapuncture  PT 70.1  INR 8.1 Discussed with Weston Brass PharmD Spoke with wife.  Pt to hold coumadin and recheck INR on 08/28/09 Bleeding  precautions discussed with wife.  PT to go to ED for any S/S of bleeding.  Current Anticoagulation Instructions: INR 2.6 Take 1.25mg  x 3 days then recheck INR on 08/31/09

## 2010-02-12 NOTE — Medication Information (Signed)
Summary: RX Folder/ [facility]ICATIONS [facility]  RX Folder/ [facility]   Imported By: Dorise Hiss 07/03/2009 11:51:41  _____________________________________________________________________  External Attachment:    Type:   Image     Comment:   External Document

## 2010-02-12 NOTE — Consult Note (Signed)
Summary: CARDIOLOGY CONSULT/ [facility]  CARDIOLOGY CONSULT/ [facility]   Imported By: Zachary George 08/08/2009 13:11:28  _____________________________________________________________________  External Attachment:    Type:   Image     Comment:   External Document

## 2010-02-12 NOTE — Letter (Signed)
Summary: External Correspondence/ LETTER & OFFICE VISIT [facility]  External Correspondence/ LETTER & OFFICE VISIT [facility]   Imported By: Dorise Hiss 10/02/2009 11:21:53  _____________________________________________________________________  External Attachment:    Type:   Image     Comment:   External Document

## 2010-02-12 NOTE — Medication Information (Signed)
Summary: new ccr - start on 7/27  --agh  Anticoagulant Therapy  Managed by: Vashti Hey, RN PCP: Dr. Doreen Beam Supervising MD: Diona Browner MD, Remi Deter Indication 1: Atrial Fibrillation Lab Used: [facility] [place] Site: [facility] INR POC 1.2  Dietary changes: no    Health status changes: no    Bleeding/hemorrhagic complications: no    Recent/future hospitalizations: yes       Details: post op atrial fib  Any changes in medication regimen? yes       Details: Started on coumadin 2.5mg  qd at [facility] on 08/08/09  Has 2.5mg  tablet  Has had 2 doses  Is on Cipro for toe surgery  Recent/future dental: no  Any missed doses?: no       Is patient compliant with meds? yes      Comments: Coumadin risks and benefits discussed with pt/wife.  Education booklet given.  Discussed foof/drug interactions, dosing schedule and appt compliacnce.  They verbalized understanding.  Allergies: 1)  ! Ace Inhibitors 2)  ! * Arb  Anticoagulation Management History:      The patient is taking warfarin and comes in today for a routine follow up visit.  He is being anticoagulated due to Atrial Fibrillation .  Positive risk factors for bleeding include an age of 75 years or older, history of CVA/TIA, and presence of serious comorbidities.  Negative risk factors for bleeding include no history of GI bleeding.  The bleeding index is 'high risk'.  Positive CHADS2 values include History of HTN, Age > 75 years old, History of Diabetes, and Prior Stroke/CVA/TIA.  Negative CHADS2 values include History of CHF.  The start date was 08/08/2009.  Anticoagulation responsible provider: Diona Browner MD, Remi Deter.  INR POC: 1.2.  Cuvette Lot#: [id number].    Anticoagulation Management Assessment/Plan:      The patient's current anticoagulation dose is Coumadin 2.5 mg tabs: take one tab every evening per coumadin clinic.  The target INR is 2.0-3.0.  The next INR is due 08/14/2009.  Anticoagulation instructions were given to patient.   Results were reviewed/authorized by Vashti Hey, RN.  He was notified by Vashti Hey RN.        Coagulation management information includes: Not pending DCCV at this time.  Current Anticoagulation Instructions: INR 1.2 Increase coumadin to 2.5mg  once daily except 5mg  on M,F

## 2010-02-12 NOTE — Progress Notes (Signed)
Summary: SOB  Phone Note Call from Patient Call back at [phone]   Caller: son - Daryl  Summary of Call: Son called due to dad c/o having some SOB recently over last couple of days.  Not sleeping good at night due to this.  Advised him that GD is out of town and he should call PMD to be seen in next couple of days to be evaluated.  If unable to see him in next few days, call us back and we can send note to GD and have him advise [place] on what to do next.  Son verbalized understanding.   Initial call taken by: Hoover Brunette, LPN,  May 29, 2009 10:09 AM  Follow-up for Phone Call       Follow-up by: Lewayne Bunting, MD, [facility],  May 31, 2009 1:26 PM

## 2010-02-12 NOTE — Medication Information (Signed)
Summary: ccr-lr  Anticoagulant Therapy  Managed by: Vashti Hey, RN PCP: Dr. Doreen Beam Supervising MD: Andee Lineman MD, Michelle Piper Indication 1: Atrial Fibrillation Lab Used: [facility] [place] Site: [place] INR POC 3.2  Dietary changes: no    Health status changes: no    Bleeding/hemorrhagic complications: no    Recent/future hospitalizations: no    Any changes in medication regimen? no    Recent/future dental: no  Any missed doses?: no       Is patient compliant with meds? yes       Allergies: 1)  ! Ace Inhibitors 2)  ! * Arb  Anticoagulation Management History:      The patient is taking warfarin and comes in today for a routine follow up visit.  He is being anticoagulated due to Atrial Fibrillation .  Positive risk factors for bleeding include an age of 35 years or older, history of CVA/TIA, and presence of serious comorbidities.  Negative risk factors for bleeding include no history of GI bleeding.  The bleeding index is 'high risk'.  Positive CHADS2 values include History of HTN, Age > 75 years old, History of Diabetes, and Prior Stroke/CVA/TIA.  Negative CHADS2 values include History of CHF.  The start date was 08/08/2009.  Anticoagulation responsible Dilyn Osoria: Andee Lineman MD, Michelle Piper.  INR POC: 3.2.  Cuvette Lot#: [id number].    Anticoagulation Management Assessment/Plan:      The patient's current anticoagulation dose is Coumadin 2.5 mg tabs: take one tab every evening per coumadin clinic.  The target INR is 2.0-3.0.  The next INR is due 08/24/2009.  Anticoagulation instructions were given to patient.  Results were reviewed/authorized by Vashti Hey, RN.  He was notified by Vashti Hey RN.         Prior Anticoagulation Instructions: INR 1.2 Increase coumadin to 2.5mg  once daily except 5mg  on M,F  Current Anticoagulation Instructions: INR 3.2 Hold coumadin tonight then take 1 tablet once daily

## 2010-02-12 NOTE — Miscellaneous (Signed)
Summary: Orders Update - Nephrology  Clinical Lists Changes  Orders: Added new Referral order of Nephrology Referral (Nephro) - Signed 

## 2010-02-12 NOTE — Medication Information (Signed)
Summary: ccr-lr  Anticoagulant Therapy  Managed by: Vashti Hey, RN PCP: Dr. Doreen Beam Supervising MD: Andee Lineman MD, Michelle Piper Indication 1: Atrial Fibrillation Lab Used: [facility]  Site: [place] INR POC 1.7  Dietary changes: no    Health status changes: yes       Details: Scheduled for PV surgery R[facility] on 9/12  Bleeding/hemorrhagic complications: no    Recent/future hospitalizations: yes       Details: Will be admitted to [facility] on 9/12 for surgery  Any changes in medication regimen? no    Recent/future dental: no  Any missed doses?: yes     Details: Will take last dose of coumadin on 09/17/09  Is patient compliant with meds? yes       Allergies: 1)  ! Ace Inhibitors 2)  ! * Arb  Anticoagulation Management History:      The patient is taking warfarin and comes in today for a routine follow up visit.  He is being anticoagulated because of Atrial Fibrillation .  Positive risk factors for bleeding include an age of 75 years or older, history of CVA/TIA, and presence of serious comorbidities.  Negative risk factors for bleeding include no history of GI bleeding.  The bleeding index is 'high risk'.  Positive CHADS2 values include History of HTN, Age > 75 years old, History of Diabetes, and Prior Stroke/CVA/TIA.  Negative CHADS2 values include History of CHF.  The start date was 08/08/2009.  His last INR was 8.1.  Anticoagulation responsible provider: Andee Lineman MD, Michelle Piper.  INR POC: 1.7.  Cuvette Lot#: [id number].    Anticoagulation Management Assessment/Plan:      The patient's current anticoagulation dose is Coumadin 2.5 mg tabs: take one tab every evening per coumadin clinic.  The target INR is 2.0-3.0.  The next INR is due 10/05/2009.  Anticoagulation instructions were given to patient.  Results were reviewed/authorized by Vashti Hey, RN.  He was notified by Vashti Hey RN.         Prior Anticoagulation Instructions: INR 2.5 Continue coumadin 1.25mg  once daily   Current  Anticoagulation Instructions: INR 1.7 Take cooumadin 2.5mg  tonight then resume 1.25mg  once daily  Pt will stop coumadin on 09/17/09 for pending PV surgery at [facility] on 09/24/09. Next INR appt will be scheduled when he gets out of hospital

## 2010-02-12 NOTE — Letter (Signed)
Summary: External Correspondence/ OFFICE VISIT DR. VYAS  External Correspondence/ OFFICE VISIT DR. VYAS   Imported By: Dorise Hiss 07/03/2009 11:46:38  _____________________________________________________________________  External Attachment:    Type:   Image     Comment:   External Document

## 2010-02-12 NOTE — Assessment & Plan Note (Signed)
Summary: ROV-PULN HTN   Visit Type:  hospital follow-up Primary Provider:  Dr. Doreen Beam   History of Present Illness: the patient is a 77 year old male with a history of multifactorial dyspnea, diastolic heart failure moderate pulmonary hypertension and recent health plus for bronchitis. He also has systemic hypertension and has proximal fibrillation .the patient has known  coronary disease and bypass surgery. Has chronic insufficiency. He does have significant baseline hypoxemia and is on oxygen therapy at home.the patient however has improved since hospital discharge. He has had a recent echocardiogram showed normal LV function.he does havef the right ventricle is dilated and has moderate tricuspid regurgitation.next  The patient has multiple risk factors for stroke in the setting of cartilage fibrillation including age hypertension vascular disease and diabetes mellitus.  He also severe peripheral vascular disease and saw Dr. early. He does not have resting pain but it does have a mottled appearance to his right leg.the patient had painful toe on the right foot and the nail has been removed. He stated he is feeling much better. The patient's plan to follow up with vascular disease at [facility]. Again today I could not palpate or Doppler any pulses in the right leg. The patient had a prior right femoral posterior tibial artery bypass graft which has been occluded.  The patient denies any chest pain orthopnea PND palpitations or syncope.  Preventive Screening-Counseling & Management  Alcohol-Tobacco     Smoking Status: never  Current Medications (verified): 1)  Glimepiride 2 Mg Tabs (Glimepiride) .... Take 1 Tablet By Mouth Once A Day 2)  Metoprolol Succinate 100 Mg Xr24h-Tab (Metoprolol Succinate) .... Take 1/2 Tablet By Mouth Once A Day 3)  Aspirin 81 Mg Tbec (Aspirin) .... Take One Tablet By Mouth Two Times A Day 4)  Simvastatin 40 Mg Tabs (Simvastatin) .... Take 1 Tab By Mouth  At Bedtime 5)  Clonidine Hcl 0.2 Mg Tabs (Clonidine Hcl) .... Take 1/2 Tablet By Mouth Twice A Day 6)  Fish Oil 1000 Mg Caps (Omega-3 Fatty Acids) .... Take 1 Capsule By Mouth Two Times A Day 7)  Plavix 75 Mg Tabs (Clopidogrel Bisulfate) .... Take One Tablet By Mouth Daily 8)  Furosemide 40 Mg Tabs (Furosemide) .... Take 1 Tablet By Mouth Once A Day 9)  Ciprofloxacin Hcl 250 Mg Tabs (Ciprofloxacin Hcl) .... Take 1 Tablet By Mouth Two Times A Day 10)  Coumadin 2.5 Mg Tabs (Warfarin Sodium) .... Take One Tab Every Evening Per Coumadin Clinic  Allergies (verified): 1)  ! Ace Inhibitors 2)  ! * Arb  Comments:  Nurse/Medical Assistant: The patient's medication list and allergies were reviewed with the patient and were updated in the Medication and Allergy Lists.  Past History:  Past Surgical History: Last updated: 09/12/2008 CORONARY ARTERY BYPASS GRAFT, HX OF (ICD-V45.81) INGUINAL HERNIORRHAPHY, HX OF (ICD-V45.89) CHOLECYSTECTOMY, HX OF (ICD-V45.79) APPENDECTOMY, HX OF (ICD-V45.79) PVD" Hayes right femto PT bypass with gortex 02/04/2008 occluded by doppler 08/2008  Family History: Last updated: 02/16/2008 there is no peripheral arterial disease or stroke in the family  Social History: Last updated: 02/16/2008 the patient is married. He is to grandchildren. 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 He does not smoke cigarettes or drink alcohol. He exercises regularly.  Risk Factors: Smoking Status: never (08/08/2009)  Past Medical History:  1. Multivessel coronary artery disease.       a.     Status post non-ST-segment myocardial infarction/four-vessel  coronary artery bypass graft, February 2009.       b.     Normal left ventricular function.       c.     Postop paroxysmal atrial fibrillation, treated with        amiodarone.   2. Peripheral vascular disease.       a.     Preoperative ankle-brachial indices:  0.66 right; 0.6 left.        b.     Right lower extremity claudication.       c.     40-60% left internal carotid artery stenosis.  status post right femoral posterior tibial artery bypass graft. Bypass graft is occluded. Severe right lower extremity ischemia  3. Dyslipidemia.   4. Type 2 diabetes mellitus.   5. Hypertension.   6. Renal insufficiency.   7. Hypoalbuminemia.   8. Obesity. Lungs pending essential hypertension  Nephrolithiasis Inguinal hernia repair Dyslipidemia ATRIAL FIBRILLATION, HX OF (ICD-V12.59) ESSENTIAL HYPERTENSION, BENIGN (ICD-401.1) AODM (ICD-250.00) PVD (ICD-443.9) CORONARY ATHEROSCLEROSIS, ARTERY BYPASS GRAFT (ICD-414.04)    Review of Systems       The patient complains of fatigue, shortness of breath, muscle weakness, leg swelling, and rash.  The patient denies malaise, fever, weight gain/loss, vision loss, decreased hearing, hoarseness, chest pain, palpitations, prolonged cough, wheezing, sleep apnea, coughing up blood, abdominal pain, blood in stool, nausea, vomiting, diarrhea, heartburn, incontinence, blood in urine, joint pain, skin lesions, headache, fainting, dizziness, depression, anxiety, enlarged lymph nodes, easy bruising or bleeding, and environmental allergies.    Vital Signs:  Patient profile:   77 year old male Height:      73 inches Weight:      197 pounds BMI:     26.08 O2 Sat:      98 % on Room air Pulse rate:   63 / minute BP sitting:   150 / 65  (left arm) Cuff size:   regular  Vitals Entered By: Carlye Grippe (August 08, 2009 2:35 PM)  Nutrition Counseling: Patient's BMI is greater than 25 and therefore counseled on weight management options.  O2 Flow:  Room air  Physical Exam  Additional Exam:  General: Well-developed, well-nourished in no distress head: Normocephalic and atraumatic eyes PERRLA/EOMI intact, conjunctiva and lids normal nose: No deformity or lesions mouth normal dentition, normal posterior pharynx neck: Supple, no JVD.  No masses,  thyromegaly or abnormal cervical nodes lungs: Normal breath sounds bilaterally without wheezing.  Normal percussion Chest: There is a separation of the sternum, without a click on palpation but with independent motion of both sides of the sternum. heart: regular rate and rhythm with normal S1 and S2, no S3 or S4.  PMI is normal.  No pathological murmurs abdomen: Normal bowel sounds, abdomen is soft and nontender without masses, organomegaly or hernias noted.  No hepatosplenomegaly musculoskeletal: Back normal, normal gait muscle strength and tone normal pulsus: unable to palpate pulses in the right leg including dorsalis pedis and posterior tibial pulse.status post toe surgery some mottling of the right foot Extremities:2+ peripheral pitting edema neurologic: Alert and oriented x 3 skin: Intact without lesions or rashes cervical nodes: No significant adenopathy psychologic: Normal affect    EKG  Procedure date:  08/09/2009  Findings:      atrial fibrillation with slow ventricular response. Heart rate 51 beats per minute  Impression & Recommendations:  Problem # 1:  CORONARY ARTERY BYPASS GRAFT, HX OF (ICD-V45.81) no recurrent chest pain. Continue medical therapy. Status post catheterization in 2009  Problem # 2:  ATRIAL FIBRILLATION, HX OF (ICD-V12.59) the patient's multiple risk factors for stroke. He will be started on Coumadin. Dabigatran is inappropriate because of his renal insufficiency. We will also start a low dose of the patient's current taking an antibiotic after his recent toe surgery. Goal INR is between 2 and 3 His updated medication list for this problem includes:    Metoprolol Succinate 100 Mg Xr24h-tab (Metoprolol succinate) .Marland Kitchen... Take 1/2 tablet by mouth once a day    Aspirin 81 Mg Tbec (Aspirin) .Marland Kitchen... Take one tablet by mouth two times a day    Plavix 75 Mg Tabs (Clopidogrel bisulfate) .Marland Kitchen... Take one tablet by mouth daily    Coumadin 2.5 Mg Tabs (Warfarin sodium)  .Marland Kitchen... Take one tab every evening per coumadin clinic  Orders: EKG w/ Interpretation ([id number])Future Orders: T-CBC No Diff ([id number]) ... 08/15/2009 T-Basic Metabolic Panel [id number]) ... 08/15/2009  Problem # 3:  PVD (ICD-443.9) the patient has significant ischemia in the right lower extremity. The patient will be seen in [facility] for second opinion. Apparently was told by Dr. Karlene Lineman he may need an amputation  Problem # 4:  ESSENTIAL HYPERTENSION, BENIGN (ICD-401.1) E. Continue current medical therapy His updated medication list for this problem includes:    Metoprolol Succinate 100 Mg Xr24h-tab (Metoprolol succinate) .Marland Kitchen... Take 1/2 tablet by mouth once a day    Aspirin 81 Mg Tbec (Aspirin) .Marland Kitchen... Take one tablet by mouth two times a day    Clonidine Hcl 0.2 Mg Tabs (Clonidine hcl) .Marland Kitchen... Take 1/2 tablet by mouth twice a day    Furosemide 40 Mg Tabs (Furosemide) .Marland Kitchen... Take 1 tablet by mouth once a day  Future Orders: T-CBC No Diff ([id number]) ... 08/15/2009 T-Basic Metabolic Panel [id number]) ... 08/15/2009  Problem # 5:  RENAL INSUFFICIENCY (ICD-588.9) the patient has some volume overload.recommended to take daily Lasix 40 mg  Patient Instructions: 1)  Lasix 40mg  daily  2)  Labs:  CBC, BMET - due in one week 3)  Start Coumadin 2.5mg  every evening  4)  Check PT/IINR on Friday, Monday at the latest (on ABO x 1 week) 5)  Follow up in  3 months  Prescriptions: COUMADIN 2.5 MG TABS (WARFARIN SODIUM) take one tab every evening per coumadin clinic  #30 x 2   Entered by:   Hoover Brunette, LPN   Authorized by:   Lewayne Bunting, MD, [facility]   Signed by:   Hoover Brunette, LPN on 44/01/270   Method used:   Electronically to        [facility]* (retail)       [address]       [place], [place]  [address]       Ph: [phone]       Fax: [phone]   RxID:   [id number] FUROSEMIDE 40 MG TABS (FUROSEMIDE) Take 1 tablet by mouth once a day  #30 x 6   Entered  by:   Hoover Brunette, LPN   Authorized by:   Lewayne Bunting, MD, [facility]   Signed by:   Hoover Brunette, LPN on 10/93/2355   Method used:   Electronically to        [facility]* (retail)       [address]       [place], [place]  [address]       Ph: [phone]  Fax: [phone]   RxID:   [id number]

## 2010-02-12 NOTE — Medication Information (Signed)
Summary: ccr-lr  Anticoagulant Therapy  Managed by: Vashti Hey, RN PCP: Dr. Doreen Beam Supervising MD: Andee Lineman MD, Michelle Piper Indication 1: Atrial Fibrillation Lab Used: [facility] [place] Site: [place] INR POC 2.6  Dietary changes: no    Health status changes: no    Bleeding/hemorrhagic complications: no    Recent/future hospitalizations: no    Any changes in medication regimen? no    Recent/future dental: no  Any missed doses?: no       Is patient compliant with meds? yes       Allergies: 1)  ! Ace Inhibitors 2)  ! * Arb  Anticoagulation Management History:      The patient is taking warfarin and comes in today for a routine follow up visit.  He is being anticoagulated due to Atrial Fibrillation .  Positive risk factors for bleeding include an age of 40 years or older, history of CVA/TIA, and presence of serious comorbidities.  Negative risk factors for bleeding include no history of GI bleeding.  The bleeding index is 'high risk'.  Positive CHADS2 values include History of HTN, Age > 31 years old, History of Diabetes, and Prior Stroke/CVA/TIA.  Negative CHADS2 values include History of CHF.  The start date was 08/08/2009.  His last INR was 8.1.  Anticoagulation responsible provider: Andee Lineman MD, Michelle Piper.  INR POC: 2.6.  Cuvette Lot#: [id number].    Anticoagulation Management Assessment/Plan:      The patient's current anticoagulation dose is Coumadin 2.5 mg tabs: take one tab every evening per coumadin clinic.  The target INR is 2.0-3.0.  The next INR is due 09/14/2009.  Anticoagulation instructions were given to patient.  Results were reviewed/authorized by Vashti Hey, RN.  He was notified by Vashti Hey RN.         Prior Anticoagulation Instructions: INR 1.8 Take coumadin 2.5mg  tonight then resume 1.25mg  once daily   Current Anticoagulation Instructions: INR 2.6 Continue coumadin 1.25mg  once daily

## 2010-02-12 NOTE — Letter (Signed)
Summary: [facility] Office Note   [facility] Office Note   Imported By: Roderic Ovens 08/31/2009 15:04:41  _____________________________________________________________________  External Attachment:    Type:   Image     Comment:   External Document

## 2010-02-12 NOTE — Letter (Signed)
Summary: External Correspondence/ OFFICE VISIT [facility]  External Correspondence/ OFFICE VISIT [facility]   Imported By: Dorise Hiss 07/03/2009 11:48:17  _____________________________________________________________________  External Attachment:    Type:   Image     Comment:   External Document

## 2010-02-13 HISTORY — PX: BELOW KNEE LEG AMPUTATION: SUR23

## 2010-02-27 ENCOUNTER — Encounter: Payer: Self-pay | Admitting: Cardiology

## 2010-03-05 ENCOUNTER — Encounter: Payer: Self-pay | Admitting: Cardiology

## 2010-03-06 NOTE — Medication Information (Signed)
Summary: Coumadin Clinic  Anticoagulant Therapy  Managed by: Inactive PCP: Dr. Doreen Beam Supervising MD: Andee Lineman MD, Michelle Piper Indication 1: Atrial Fibrillation Lab Used: [facility] [place] Site: [place] [place]          Comments: Called spoke with pt's wife, she states pt has been off his Coumadin since his surgery 09/11.  No longer on Coumadin. Cloyde Reams RN  February 27, 2010 9:22 AM   Allergies: 1)  ! Ace Inhibitors 2)  ! * Arb  Anticoagulation Management History:      Anticoagulation is being administered due to Atrial Fibrillation .  Positive risk factors for bleeding include an age of 75 years or older, history of CVA/TIA, and presence of serious comorbidities.  Negative risk factors for bleeding include no history of GI bleeding.  The bleeding index is 'high risk'.  Positive CHADS2 values include History of HTN, Age > 75 years old, History of Diabetes, and Prior Stroke/CVA/TIA.  Negative CHADS2 values include History of CHF.  The start date was 08/08/2009.  His last INR was 8.1.  Anticoagulation responsible provider: Andee Lineman MD, Michelle Piper.    Anticoagulation Management Assessment/Plan:      The patient's current anticoagulation dose is Coumadin 2.5 mg tabs: take one tab every evening per coumadin clinic.  The target INR is 2.0-3.0.  The next INR is due 10/05/2009.  Anticoagulation instructions were given to patient.  Results were reviewed/authorized by Inactive.         Prior Anticoagulation Instructions: INR 1.7 Take cooumadin 2.5mg  tonight then resume 1.25mg  once daily  Pt will stop coumadin on 09/17/09 for pending PV surgery at [facility] on 09/24/09. Next INR appt will be scheduled when he gets out of hospital

## 2010-04-24 ENCOUNTER — Ambulatory Visit: Payer: Self-pay | Admitting: Cardiology

## 2010-04-29 LAB — GLUCOSE, CAPILLARY
Glucose-Capillary: 128 mg/dL — ABNORMAL HIGH (ref 70–99)
Glucose-Capillary: 136 mg/dL — ABNORMAL HIGH (ref 70–99)
Glucose-Capillary: 177 mg/dL — ABNORMAL HIGH (ref 70–99)
Glucose-Capillary: 181 mg/dL — ABNORMAL HIGH (ref 70–99)

## 2010-04-29 LAB — CBC
HCT: 29.8 % — ABNORMAL LOW (ref 39.0–52.0)
MCV: 87.9 fL (ref 78.0–100.0)
MCV: 88.3 fL (ref 78.0–100.0)
Platelets: 261 10*3/uL (ref 150–400)
Platelets: 367 10*3/uL (ref 150–400)
RDW: 16.5 % — ABNORMAL HIGH (ref 11.5–15.5)
WBC: 10.1 10*3/uL (ref 4.0–10.5)
WBC: 9.6 10*3/uL (ref 4.0–10.5)

## 2010-04-29 LAB — COMPREHENSIVE METABOLIC PANEL
AST: 22 U/L (ref 0–37)
Albumin: 3.5 g/dL (ref 3.5–5.2)
Calcium: 9.2 mg/dL (ref 8.4–10.5)
Chloride: 102 mEq/L (ref 96–112)
Creatinine, Ser: 1.47 mg/dL (ref 0.4–1.5)
GFR calc Af Amer: 56 mL/min — ABNORMAL LOW (ref >60)
Total Protein: 7.9 g/dL (ref 6.0–8.3)

## 2010-04-29 LAB — POCT I-STAT, CHEM 8
Calcium, Ion: 1.15 mmol/L (ref 1.12–1.32)
Glucose, Bld: 119 mg/dL — ABNORMAL HIGH (ref 70–99)
HCT: 37 % — ABNORMAL LOW (ref 39.0–52.0)
Hemoglobin: 12.6 g/dL — ABNORMAL LOW (ref 13.0–17.0)
TCO2: 30 mmol/L (ref 0–100)

## 2010-04-29 LAB — URINALYSIS, ROUTINE W REFLEX MICROSCOPIC
Nitrite: NEGATIVE
Specific Gravity, Urine: 1.025 (ref 1.005–1.030)
Urobilinogen, UA: 1 mg/dL (ref 0.0–1.0)
pH: 5.5 (ref 5.0–8.0)

## 2010-04-29 LAB — TYPE AND SCREEN: Antibody Screen: NEGATIVE

## 2010-04-29 LAB — PROTIME-INR: INR: 1 (ref 0.00–1.49)

## 2010-04-29 LAB — BASIC METABOLIC PANEL
BUN: 23 mg/dL (ref 6–23)
GFR calc Af Amer: >60 mL/min (ref >60)
Glucose, Bld: 120 mg/dL — ABNORMAL HIGH (ref 70–99)
Sodium: 134 mEq/L — ABNORMAL LOW (ref 135–145)

## 2010-04-29 LAB — APTT: aPTT: 31 seconds (ref 24–37)

## 2010-05-01 ENCOUNTER — Ambulatory Visit: Payer: Self-pay | Admitting: Cardiology

## 2010-05-28 NOTE — Assessment & Plan Note (Signed)
[facility]                          [facility] OFFICE NOTE   [name] Love, [name] Love                      MRN:          [id number]  DATE:06/18/2007                            DOB:          [birth year]    PRIMARY CARDIOLOGIST:  Learta Codding, M.D., [facility]   REASON FOR VISIT:  Scheduled followup.  Please refer to Dr. Margarita Mail  previous office note of April 26, 2007, for full details.   At that time, the patient presented following revascularization surgery  at [facility], where he underwent four-vessel CABG, by Dr.  Tyrone Sage, in February.  This was following presentation with NSTEMI.  Left ventricular function was normal.  The patient did have postop  atrial fibrillation, treated with amiodarone.  This was discontinued at  time of his last office visit.  The patient was in NSR by 12-lead EKG.   The patient continues to do well, per his and his family's report.  He  is participating in cardiac rehab here in [place].  He denies any  exertional angina pectoris.  He does have some residual chest soreness.  The patient is also still following up with Dr. Tyrone Sage in [place].  He was noted to have some instability of the lower pole of the sternal  incision; however, there was no evidence of wire fracture.   The patient is ambulating 1-1/2 miles a day.  He does note some right  calf claudication after approximately 1/2 a mile.  He denies any  symptoms on the left.  Notably, his preop evaluation revealed moderately  depressed ABIs: 0.66 right; 0.6 left.   The patient also has mild left ICA disease (40-60%), with no significant  disease on the right.   The patient was also referred for a dermatology evaluation, by Dr.  Andee Lineman, after noting a lesion on the left index finger.  This was  diagnosed as impetigo, and he was treated with doxycycline, by Dr.  Marcelino Duster.   According to his family, this has significantly improved, as has the  cellulitis of his chest wound.   Dr. Andee Lineman was also concerned that the patient appeared jaundiced.  Liver enzymes were drawn and suggested elevated alkaline phosphatase of  179, with otherwise normal studies.  Albumin was also low at 3.0.  TSH  was normal.   The patient does have renal insufficiency, with recent blood work  suggesting a peak creatinine 1.7.  This improved to 1.4, by most recent  blood work   CURRENT MEDICATIONS:  1. Lipitor 20 daily.  2. Clonidine 0.1 b.i.d.  3. Metformin 500 daily.  4. Aspirin 160 two daily.  5. Fish oil 2 tablets daily.  6. Toprol-XL 75 daily.   PHYSICAL EXAMINATION:  Blood pressure 150/76, pulse 56 and regular,  weight 208.8.  GENERAL:  A 77 year old male, obese, sitting upright, no distress.  HEENT:  Normocephalic, atraumatic.  NECK:  Palpable carotid pulse without bruits; no JVD at 90 degrees.  LUNGS:  Diminished breath sounds at bases, without crackles or wheezes.  HEART:  Regular rate and rhythm (S1 and S2), no significant murmurs.  No  rubs.  ABDOMEN:  Protuberant, nontender.  EXTREMITIES:  Palpable dorsalis pedis pulses with bilateral pedal edema  (left greater right).  No focal deficit.   IMPRESSION:  1. Multivessel coronary artery disease.      a.     Status post non-ST-segment myocardial infarction/four-vessel       coronary artery bypass graft, February 2009.      b.     Normal left ventricular function.      c.     Postop paroxysmal atrial fibrillation, treated with       amiodarone.  2. Peripheral vascular disease.      a.     Preoperative ankle-brachial indices:  0.66 right; 0.6 left.      b.     Right lower extremity claudication.      c.     40-60% left internal carotid artery stenosis.  3. Dyslipidemia.  4. Type 2 diabetes mellitus.  5. Hypertension.  6. Renal insufficiency.  7. Hypoalbuminemia.  8. Obesity.   PLAN:  1. Schedule renal ultrasound with Dopplers in our [facility] office,      to rule out renal  artery stenosis.  The patient has documented      peripheral vascular disease, and recent blood work has shown a rise      in his creatinine.  He did not have a distal aortogram during his      recent cardiac catheterization.  Following this, the patient will      be evaluated by Dr. Tonny Bollman later that same day, regarding      further recommendations with respect to his documented peripheral      vascular disease.  2. Surveillance fasting lipid/liver profile.  The patient was not on a      statin prior to his recent hospitalization, and he has not had any      followup blood work.  3. A urinalysis to rule out proteinuria, in light of his persistent      hypoalbuminemia.  4. Schedule return clinic followup with myself and Dr. Andee Lineman in 3      months.      Gene Serpe, PA-C  Electronically Signed      Learta Codding, MD,FACC  Electronically Signed   GS/MedQ  DD: 06/18/2007  DT: 06/18/2007  Job #: [id number]   cc:   Doreen Beam, MD

## 2010-05-28 NOTE — Assessment & Plan Note (Signed)
OFFICE VISIT   KHARTER, BREW  DOB:  November 06, 1929                                       09/22/2008  CHART#:[id number]   The patient presents today for continued follow-up of his right leg  ischemia.  He is status post a right femoral to posterior tibial bypass  by Dr. Liliane Bade in 02/04/2008.  I had seen him on one occasion in  08/18/2008 where he had occlusion of his graft, this was a prosthetic  graft.  He did have a viable foot and I recommended observation only.  He is having claudication but no rest pain and no tissue loss.  On  physical exam, he does not have any evidence of tissue loss and his foot  is warm.  He is involved in a walking program.  I have encouraged him to  continue this.  I have asked him to notify us should he develop any new  difficulty with tissue loss.  We will see him again on an as-needed  basis.   Larina Earthly, M.D.  Electronically Signed   TFE/MEDQ  D:  09/22/2008  T:  09/25/2008  Job:  [id number]   cc:   Learta Codding, MD,FACC  Doreen Beam, MD

## 2010-05-28 NOTE — Procedures (Signed)
VASCULAR LAB EXAM   INDICATION:  Right lower extremity pain with walking.   HISTORY:  Right femoral to posterior tibial artery bypass graft.   EXAM:  Right lower extremity duplex.   IMPRESSION:  1. The right femoral to posterior tibial artery bypass graft appears      to be totally occluded due to the lack of Doppler flow signal and      internal color flow.  2. Dampened monophasic flow is noted in the right posterior tibial      artery which is fed via collateral circulation.   ___________________________________________  Larina Earthly, M.D.   CH/MEDQ  D:  08/18/2008  T:  08/18/2008  Job:  [id number]

## 2010-05-28 NOTE — Op Note (Signed)
NAME:  [name] Love, [name] Love NO.:  [id number]   MEDICAL RECORD NO.:  [id number]          PATIENT TYPE:  INP   LOCATION:  3309                         FACILITY:  [facility]   PHYSICIAN:  Balinda Quails, M.D.    DATE OF BIRTH:  1929-07-02   DATE OF PROCEDURE:  02/04/2008  DATE OF DISCHARGE:                               OPERATIVE REPORT   SURGEON:  Balinda Quails, MD   ASSISTANT:  Jerold Coombe, PA   ANESTHESIA:  General endotracheal.   PREOPERATIVE DIAGNOSIS:  Ischemic rest pain, right foot.   POSTOPERATIVE DIAGNOSIS:  Ischemic rest pain, right foot.   PROCEDURE:  Right femoral posterior tibial bypass with Gore-Tex graft.   CLINICAL NOTE:  [name] Love is a 77 year old male with atherosclerotic  vascular disease.  He underwent coronary artery bypass in February 2009.  He has had progressive pain in his right lower extremity, worsening  claudication, and increasing rest pain.  Arteriography revealed  occlusion of the right superficial femoral and popliteal arteries,  reconstitution of the posterior tibial artery is dominant runoff to the  foot.  He has had saphenous vein harvested from both legs, duplex vein  mapping did not reveal significant arm vein.  Brought to the operating  room at this time for right femoral tibial bypass with planned  prosthetic Gore-Tex graft for relief of ischemic rest pain.   PROCEDURE NOTE:  The patient was brought to the operating room in stable  condition.  Placed under general endotracheal anesthesia.  Right leg  prepped and draped in a sterile fashion.   A longitudinal skin incision was made through the right groin.  Dissection was carried down through the subcutaneous tissue with  electrocautery.  Deep dissection was carried down through the  lymphatics.  The right common femoral artery exposed, mobilized up to  the inguinal ligament, and encircled proximally with a vessel loop.  Distal dissection was carried down to expose the  origin of the profunda  and superficial femoral arteries which were each freed and encircled  with vessel loops.  An excellent right femoral pulse was present.   A right calf incision was then made along the posterior margin of the  tibia proximally.  Dissection was carried down through the subcutaneous  tissue.  The gastrocnemius fascia was taken down the soleus taking off  the posterior margin of the tibia.  The right posterior tibial artery  was identified.  This was 2.53 mm in size.  Mobilized and encircled  proximally and distally with vessel loops.   A subcutaneous tunnel was prepared between the 2 incisions and a 6-mm  Propaten stretch ringed Gore-Tex graft was placed through the tunnel.  The patient was administered 5000 units of heparin intravenously.  The  right femoral vessel was controlled with clamps.  A longitudinal  arteriotomy was made in the right common femoral artery.  The Gore-Tex  graft beveled and anastomosed end-to-side to the right common femoral  artery using running 6-0 Prolene suture.  The artery was flushed, clamps  removed, and the graft was controlled with a fistula clamp.  The posterior tibial artery was controlled proximally and distally with  bulldog clamps.  A longitudinal arteriotomy was made.  The Gore-Tex  graft beveled and anastomosed end-to-side to the right posterior tibial  artery using running 7-0 Prolene suture.  The graft was then flushed.  Clamps were then removed.  Excellent flow was present.  Adequate  hemostasis was obtained.  Sponge and instrument counts were correct.   The groin incision was closed with running 2-0 Vicryl suture in 2  subcutaneous layers with Monocryl to skin and Dermabond.   The cath incision was closed with interrupted layer of 2-0 Vicryl suture  in the muscle and running 2-0 Vicryl suture in the subcutaneous tissue,  Monocryl to skin, and Dermabond applied.   No apparent complications.  The patient was  transferred to the recovery  room in stable condition.      Balinda Quails, M.D.  Electronically Signed     PGH/MEDQ  D:  02/04/2008  T:  02/04/2008  Job:  [id number]   cc:   Doreen Beam, MD  Sheliah Plane, MD

## 2010-05-28 NOTE — Assessment & Plan Note (Signed)
OFFICE VISIT   BOND, GRIESHOP  DOB:  [birth year]                                       08/03/2009  CHART#:[id number]   [name] Love presents today for evaluation of bilateral lower extremity  arterial insufficiency.  He is a very complex patient.  He had undergone  right femoral to posterior tibial bypass by Dr. Liliane Bade on February 04, 2008 due to rest pain in [name] right foot.  He had early occlusion of  [name] prosthetic fem posterior tibial bypass..  He has had a tolerable  level of ischemia since that time.  He does have failing health, was  recently admitted to [facility] for congestive heart failure and  apparently has valvular dysfunction as well.  He does have swelling in  both lower extremities.  He does have changes of ischemia on both feet  with cyanotic tips of the toes of both feet.  Right now [name] a main pain  is related to a nail bed on [name] left second toe.Marland Kitchen  He does not have any  evidence of infection from this.  He underwent noninvasive laboratory  studies in our office  and this shows no significant change from prior  studies.  He does have a known occlusion of [name] right fem posterior  tibial bypass.  Ankle arm index 0.55 on right and 0.64 on the left.  I  feel that this may be falsely elevated on the right due to  calcification.  [name] waveforms are monophasic.  I had a very long  discussion with [name] Love and [name] Love present.  I explained  that he does not have any had progressive tissue loss currently and I  would recommend against any type of bypass.  I explained that [name] only  option would be a prosthetic femoral to posterior tibial redo bypass  with even less likelihood of success than [name] first procedure.  I  explained that prosthetic bypass of the tibial vessels has very poor  long-term durability.  He is uncomfortable with this pain in [name] foot  but currently is able to continue to tolerate this.  I explained that if  he had progressive tissue loss or intolerable pain, the decision would  have to be made to proceed with amputation versus a redo bypass with  very low chance for long-term durability I explained this also would put  him at significant wrist to [name] significant cardiac difficulty with  congestive failure admission within the last month..  They understand  this.  [name] Love has very poor understanding of this and does not want  to consider amputation and feels that redo bypass if even with very  short duration of durability would be preferable.  I certainly do not  feel that this would be in Mr. Hernandez best interest..  We will see him  again in 1 month and he will discuss this further with Dr. Ulice Brilliant and Dr.  Andee Lineman.     Larina Earthly, M.D.  Electronically Signed   TFE/MEDQ  D:  08/03/2009  T:  08/03/2009  Job:  [id number]   cc:   Denny Peon. Ulice Brilliant, D.P.M.  Learta Codding, MD,FACC

## 2010-05-28 NOTE — Consult Note (Signed)
NAME:  [name] Love, [name] Love NO.:  [id number]   MEDICAL RECORD NO.:  [id number]          PATIENT TYPE:  INP   LOCATION:  2926                         FACILITY:  [facility]   PHYSICIAN:  Sheliah Plane, MD    DATE OF BIRTH:  02/08/1929   DATE OF CONSULTATION:  DATE OF DISCHARGE:                                 CONSULTATION   FOLLOWUP CARDIOLOGIST:  Learta Codding, MD,FACC.   PRIMARY CARE PHYSICIAN:  Doreen Beam, M.D. in [place].   REASON FOR CONSULTATION:  Non-Q wave myocardial infarction.   HISTORY OF PRESENT ILLNESS:  The patient is a 77 year old male who had  been having stuttering substernal chest pain radiating to his hands for  the past week, first noted on Sunday.  When he sat down and rested, it  improved.  He denies any diaphoresis or nausea or vomiting.  Again, on  Wednesday he had recurrent similar symptoms.  Ultimately he was seen in  Dr. Bonnita Levan office on Thursday and referred to the [facility] and  admitted directly and seen by Dr. Andee Lineman.  It is possible that the  patient was given Plavix in [place], but it is not known the amount and  full records do not accompany the patient.  The CK-MBs were noted to be  elevated, peak CK was 164 with an MB of 11, the troponin 3.75.  He was  transferred to [facility] on Thursday afternoon, the 19th, and  underwent cardiac catheterization last night on the 20th.  He was given  75 of Plavix here Friday morning.  He has had no previous history of  myocardial infarction.  No previous history of angioplasty or previous  cardiac surgery.   CARDIAC RISK FACTORS:  1. Long-standing hypertension.  2. His lipid status is unknown.  3. He has had approximately 8 months of diabetes, originally diagnosed      because his wife was checking her glucose and decided to check his      and found it to be over 200.  4. He is a nonsmoker.   FAMILY HISTORY:  Very vague.  His father died at 54 of unknown causes.  Mother died at age  29 with hypertension but cause of death was not  known.  He has one sister without has hypertension.  The patient denies  any previous stroke.  He does have claudication that has been evaluated  by Dr. Madilyn Fireman for this.  He notes that he claudicates after walking about  25 yards in both calves.  He denies renal insufficiency.   PAST SURGICAL HISTORY:  1. Appendectomy at age 77.  2. Cholecystectomy.  3. Kidney stones.  4. Hernia repair.   SOCIAL HISTORY:  The patient is married and lives with his wife.  He is  retired from [employer] in [place] and also ran a gas station for many  years.  He denies alcohol use.   MEDICATIONS:  1. Metformin 500 mg daily.  2. Diclofenac 50 mg twice a day.  3. Aspirin 81 mg twice a day.  4. Atenolol 50 mg every morning and 25  mg q.p.m.  5. Fish oil.  6. The patient also believes that he was given Plavix in [place].   ALLERGIES:  The patient's chart is not marked with any allergies,  however, he notes that some blood pressure pill that he took in the past  caused swelling of his lips and tongue.  His wife thinks it was PRINIVIL  which caused angioedema.  He is not currently taking an ACE inhibitor.   REVIEW OF SYSTEMS:  CARDIAC:  Positive for chest pain and exertional  shortness of breath.  Denies orthopnea, presyncope, syncope, or  palpitations.  GENERAL:  The patient denies any fever, chills, or night  sweats.  Over the past two weeks, he has been fatiguing quickly.  His  wife notes that just shaving in the morning he has to go and sit down  and rest.  He has had no definite TIAs, though he note that occasionally  he sees black spots in both eyes.  GASTROINTESTINAL:  He denies any  blood in his stool or urine.  He has arthritis which involves his knees,  especially the right knee.  Bilateral calf claudication as noted above.  He denies psychiatric history.  He has a history of being treated for  diabetes for the last 8 months.   PHYSICAL EXAMINATION:   VITAL SIGNS:  Blood pressure is 154/45, heart  rate is 85, respiratory rate is 18.  GENERAL:  The patient is awake, alert, neurologically intact.  HEENT:  Pupils are equal, round, and reactive to light.  NECK:  Without carotid bruits.  LUNGS:  Clear bilaterally.  CARDIAC:  Reveals a regular rate and rhythm without murmur or gallop.  ABDOMEN:  Benign without palpable masses.  The liver is not palpably  enlarged.  The right groin cath site is without significant hematoma.  LOWER EXTREMITIES:  He has no pedal pulses that were palpable on either  side.  He has superficial venous varicosities in both lower extremities  but the saphenous vein at the ankle appears intact bilaterally.   LABORATORY FINDINGS:  White count is 11,000, hematocrit is 39.6.  Creatinine is 1.1, glucose 134, bilirubin is elevated slightly at 1.4.  Hemoglobin A1c is pending.  Lipid panel is pending.   Cardiac catheterization films are reviewed.  The patient has diffuse 3-  vessel disease with high grade stenosis.  The left main is very short,  almost separate ostium, appears to be a significant plaque in the origin  of the circumflex coronary artery which is dominant.  The right coronary  artery is a 60% stenosis and is a small non-dominant vessel.  The LAD  has proximal 70-80% stenosis near the ostium.  In addition, there is a  long tubular stenosis in the LAD of 60-70% involving a moderate sized  diagonal.  There is in the proximal circumflex after the take off of the  first OM, there is 40-50% blockage and overall ventricular function is  preserved.  With the patient's significant proximal circumflex disease,  a diffuse disease of the LAD, and a circumflex dominant system, I agree  with the recommendation to proceed with coronary artery bypass grafting.  The risks and options are discussed with the patient including bleeding,  stroke, myocardial infarction, blood transfusion.  The patient is  willing to proceed.    We will obtain Doppler studies, fasting lipid profile.  In addition, we  will track down the records from [place] to find out how much Plavix the  patient received.  It appears that he probably got 75 mg and not a 300  or 600 mg loading  dose.   Tentatively plan for surgery on Monday, February 32rd unless we  determine that he was given a large loading dose of Plavix yesterday.      Sheliah Plane, MD  Electronically Signed     EG/MEDQ  D:  03/06/2007  T:  03/07/2007  Job:  [id number]   cc:   Arturo Morton. Riley Kill, MD, [facility]  Doreen Beam, MD

## 2010-05-28 NOTE — Op Note (Signed)
NAME:  [name] Love, [name] Love               ACCOUNT NO.:  [id number]   MEDICAL RECORD NO.:  [id number]          PATIENT TYPE:  AMB   LOCATION:  SDS                          FACILITY:  [facility]   PHYSICIAN:  Balinda Quails, M.D.    DATE OF BIRTH:  1929-03-23   DATE OF PROCEDURE:  01/21/2008  DATE OF DISCHARGE:                               OPERATIVE REPORT   PHYSICIAN:  Balinda Quails, M.D.   DIAGNOSIS:  Ischemic rest pain, right foot.   PROCEDURE:  Right lower extremity arteriogram.   ACCESS:  Left common femoral artery 5-French sheath.   CONTRAST:  Visipaque 85 mL.   COMPLICATIONS:  None apparent.   CLINICAL NOTE:  [name] Love is a 77 year old gentleman who  previously underwent coronary artery bypass.  He has had progressive  deterioration in symptoms in his right lower extremity and now with  ischemic rest pain.  Brought to the Cath Lab at this time for diagnostic  workup with arteriography.   PROCEDURE NOTE:  The patient was brought to the Cath Lab in stable  condition.  Placed in supine position.  Both groins were prepped and  draped in a sterile fashion.  Skin and subcutaneous tissue of left groin  instilled with 1% lidocaine.  An 18-guage needle introduced into the  left common femoral artery.  A 0.035 Bentson guidewire advanced through  the needle into the mid abdominal aorta.  A 5-French sheath advanced  over the guidewire.  Flushed with heparin saline solution.  A crossover  IMA catheter was then advanced over the guidewire and engaged into the  right common iliac artery origin.  Oblique right iliac arteriogram  obtained.  The right common and external iliac arteries were patent with  mild atherosclerotic disease.  The hypogastric artery was patent with  scattered atherosclerotic disease.   An angled Glidewire was then advanced down into the right external iliac  artery and an end-hole catheter passed over the guidewire.  Right lower  extremity arteriogram obtained.  This  revealed patent right common  femoral artery.  The right profunda femoris artery was intact.  Right  superficial femoral artery was occluded just beyond its origin.  Extensive profunda thigh collaterals were evident.  The right popliteal  artery occluded.  There was reconstitution of the proximal right  posterior tibial artery.  The right peroneal artery reconstituted in the  mid calf.  The right anterior tibial artery was occluded.  The right  posterior tibial artery provided flow to the plantar arteries in the  right foot.   A lateral foot image was obtained, verified the right posterior tibial  artery dominant flow into the right foot.   This completed the arteriogram procedure.  The catheter removed.  Left  femoral sheath removed.  No apparent complications.   FINAL IMPRESSION:  1. Patent right iliac system.  2. Occluded right superficial femoral and popliteal artery.  3. Reconstitution of the right posterior tibial and peroneal arteries      with dominant right foot runoff, right posterior tibial artery.   DISPOSITION:  Due to a history of  coronary artery bypass and previous  saphenous vein harvest, the patient will be evaluated in the office with  vein mapping for planned right femoral posterior tibial bypass for limb  salvage.      Balinda Quails, M.D.  Electronically Signed     PGH/MEDQ  D:  01/21/2008  T:  01/22/2008  Job:  [id number]   cc:   Sheliah Plane, MD

## 2010-05-28 NOTE — Op Note (Signed)
NAME:  [name] Love, [name] Love NO.:  [id number]   MEDICAL RECORD NO.:  [id number]          PATIENT TYPE:  INP   LOCATION:  2302                         FACILITY:  [facility]   PHYSICIAN:  Sheliah Plane, MD    DATE OF BIRTH:  Mar 06, 1929   DATE OF PROCEDURE:  03/08/2007  DATE OF DISCHARGE:                               OPERATIVE REPORT   PREOPERATIVE DIAGNOSIS:  Coronary occlusive disease with recent  myocardial and subendocardial myocardial infarction.   POSTOPERATIVE DIAGNOSIS:  Coronary occlusive disease with recent  myocardial and subendocardial myocardial infarction.   SURGICAL PROCEDURE:  Coronary artery bypass grafting x 4, the left  internal mammary to the left anterior descending coronary, reverse  saphenous vein graft to the diagonal coronary artery, reverse saphenous  vein graft to the obtuse marginal coronary artery, and reverse saphenous  vein graft to the distal circumflex coronary artery with bilateral thigh  endovein harvesting.   SURGEON:  Dr. Tyrone Sage.   FIRST ASSISTANT:  Gershon Crane, [place].   BRIEF HISTORY:  The patient is a 77 year old male who presented with  prolonged chest pain, had positive enzymes.  He was transferred to [facility] because of subendocardial myocardial infarction.  He had been  pretreated with 600 mg of Plavix.  Cardiac catheterization was performed  by Dr. Riley Kill, which demonstrated significant three-vessel coronary  artery disease.  He has a very small nondominant right system that is  not bypassable, a dominant circumflex with an ostial lesion, a very  short left main.  He also had a 70% stenosis of the obtuse marginal, a  small posterior descending arising from the distal circumflex, 60% to  70%.  This vessel was small.  A moderate-sized diagonal coronary artery  and a high-grade LAD stenosis after the takeoff of the diagonal.  Overall ventricular function was preserved.  Because of the patient's  significant three-vessel  coronary artery disease, coronary artery bypass  grafting was recommended.   DESCRIPTION OF PROCEDURE:  Swan-Ganz and arterial line monitors were  placed.  The patient underwent general endotracheal anesthesia without  incident.  Skin of the chest and legs was prepped with Betadine and  draped in the usual sterile manner.  Using the [employer] endovein  harvesting system, vein was harvested initially from the right thigh;  however, below the knee it became small.  Additional vein was harvested  from the left thigh endoscopically.  Median sternotomy was performed.  Left internal mammary artery was dissected down as a pedicle graft.  The  distal artery was divided, had good free flow. The pericardium was  opened and overall ventricular function appeared preserved.  The patient  was systemically heparinized.  Ascending aorta was cannulated and the  right atrium was cannulated.  An aortic root vent cardioplegia needle  was introduced into the ascending aorta.  The patient was placed on  cardiopulmonary bypass, 2.4 liters per minute per meter squared.  Sites  of anastomosis were selected and dissected at the epicardium.  The  patient's body temperature cooled to 30 degrees.  Aortic crossclamp was  applied and 500 mL of cold  blood potassium cardioplegia was administered  with rapid diastolic arrest.  The heart and myocardial septal  temperatures were monitored throughout the crossclamp.  Attention was  turned first to the obtuse marginal coronary artery, which was opened  and admitted a 1.5-mm probe.  Using a running 7-0 Prolene, distal  anastomosis was performed.  The heart was then elevated.  A large distal  circumflex branch was identified.  A much smaller posterior descending  branch arose from this but was too small to bypass.  The distal  circumflex was opened and admitted a 1.5-mm probe.  Using running 7-0  Prolene, a segment of reverse saphenous vein graft was anastomosed.  Attention was  then turned to the diagonal coronary artery, which was  opened and admitted a 1.5 mm probe.  Using a running 7-0 Prolene, distal  anastomosis was performed with segment reverse saphenous vein graft.  Attention was then turned to the left anterior descending coronary  artery, which was opened between the mid and distal third vessel and  admitted a 1.5-mm probe distally.  Using running a 8-0 Prolene, the left  internal mammary artery was anastomosed to the left anterior descending  coronary artery.  With the crossclamp still on, three punch aortotomies  were performed.  The patient did have somewhat of a thickened aorta.  The aorta was carefully flushed out to remove any loose calcific or  atheromatous debris.  Each of thethree vein grafts were anastomosed to  the ascending aorta.  The distal circumflex graft was brought around the  right side of the heart.  Before completion of the aortotomy, the heart  was allowed to passively fill and deair.  Aortic crossclamp was removed.  Total crossclamp time of 81 minutes.  With release of the bulldog on the  mammary artery, there was prompt rise in myocardial septal temperature.  The patient spontaneously converted to a sinus rhythm.  Sites of  anastomosis were inspected and free of bleeding.  The patient was then  ventilated and weaned from cardiopulmonary bypass without difficulty.  He remained hemodynamically stable.  He was decannulated in the usual  fashion.  Total pump time was 107 minutes.  With operative field  hemostatic, two atrial and two ventricular pacing wires applied, graft  marker was applied.  Left pleural and a Blake mediastinal drain were  left in place.  Pericardium was loosely reapproximated, the sternum  closed with #6 stainless steel wire, fascia closed with interrupted 0  Vicryl, running 3-0 Vicryl in subcutaneous tissue, 4-0 subcuticular  stitch in skin edges.  Dry dressings were applied.  Sponge and needle  count was  reported as correct at completion of the procedure.  The  patient tolerated the procedure without obvious complication and was  transferred to the surgical intensive care unit for further  postoperative care.      Sheliah Plane, MD  Electronically Signed     EG/MEDQ  D:  03/10/2007  T:  03/10/2007  Job:  [id number]   cc:   Arturo Morton. Riley Kill, MD, [facility]  Learta Codding, MD,FACC

## 2010-05-28 NOTE — Assessment & Plan Note (Signed)
OFFICE VISIT   DANH, BAYUS  DOB:  1929/09/16                                       02/24/2008  CHART#:[id number]   The patient underwent right lower extremity revascularization with right  femoral posterior tibial bypass using Gore-Tex graft 02/04/2008 at [facility].  This was carried out for ischemic rest pain.   Return office visit at this time his graft is patent and a right ABI of  1.0.   He is now free of any ischemic rest pain.  Beginning to ambulate  further.  Notes mild to moderate swelling in his right foot.   BP 135/81, pulse 56 per minute.  Right lower extremity reveals incisions  to be healing unremarkably.  Well-perfused right foot.   Overall the patient is doing well following his recent surgery.  We will  plan followup per protocol for surveillance of his bypass graft.  To  begin Plavix 75 mg daily in addition to aspirin 81 mg daily.   Balinda Quails, M.D.  Electronically Signed   PGH/MEDQ  D:  02/24/2008  T:  02/25/2008  Job:  [id number]   cc:   Sheliah Plane, MD

## 2010-05-28 NOTE — Assessment & Plan Note (Signed)
OFFICE VISIT   [name] Love, [name] Love  DOB:  Jun 03, 1929                                       08/18/2008  CHART#:[id number]   This patient presents today for evaluation of his right leg pain.  He  reports this has been present for approximately 2 weeks.  He is status  post right femoral to posterior tibial bypass with Gore-Tex graft by Dr.  Liliane Bade on 02/04/2008.  He had been having worsening claudication and  rest pain.  Arteriogram revealed occlusion of his right superficial  femoral artery and popliteal artery with reconstitution of his posterior  tibial arteries the dominant runoff to the foot.  He had had his  saphenous veins harvested bilaterally and did not have acceptable arm  vein.  He therefore underwent prosthetic fem-tib bypass.  He most  recently was seen in our office with a patent graft on 07/27/2008.  Soon  after that, he has been having increasing claudication.  He had stopped  taking Plavix and was simply taking an aspirin therapy.  He has been  restarted on Plavix over the last several days due to the leg discomfort  and reports some improvement with this.   PHYSICAL EXAMINATION:  He does have a 2+ right femoral pulse.  I do not  palpate a popliteal pulse or tibial pulses.  He does not have a profound  ischemia of his foot.   He underwent a repeat ultrasound today in our office and this reveals  occlusion of his right fem-tib graft.  I had a long discussion with the  patient, his wife and daughter present.  I explained that with an early  failure of his graft which was placed in January that I would recommend  observation only at this time.  He does appear to be back at his  baseline claudication with mild rest pain.  I explained that hopefully  he can continue to not have limb-threatening ischemia and tissue loss.  I explained that durability for prosthetic fem-tib bypass is poor  initially and certainly is much more disadvantaged with  repeat  interventions.  I plan to see him again in 6 weeks and they will notify  me sooner should he develop any worsening rest pain or tissue loss.  They understand that this will be a lifelong concern of limb-threatening  ischemia.   Larina Earthly, M.D.  Electronically Signed   TFE/MEDQ  D:  08/18/2008  T:  08/21/2008  Job:  [id number]   cc:   Balinda Quails, M.D.  Doreen Beam, MD  Sheliah Plane, MD  Learta Codding, MD,FACC

## 2010-05-28 NOTE — H&P (Signed)
NAME:  [name] Love, [name] Love NO.:  [id number]   MEDICAL RECORD NO.:  [id number]          PATIENT TYPE:  INP   LOCATION:  2899                         FACILITY:  [facility]   PHYSICIAN:  Balinda Quails, M.D.    DATE OF BIRTH:  12-26-1929   DATE OF ADMISSION:  02/04/2008  DATE OF DISCHARGE:                              HISTORY & PHYSICAL   ADMITTING PHYSICIAN:  Balinda Quails, MD   PRIMARY CARE PHYSICIAN:  Doreen Beam, MD   ADMISSION DIAGNOSIS:  Ischemic right lower extremity.   HISTORY:  Tanuj Mullens is a 77 year old gentleman with known peripheral  vascular disease.  Risk factors include type 2 diabetes, hypertension,  coronary artery disease, and hyperlipidemia.   He was evaluated in the office 3-4 years ago with peripheral vascular  disease, found at that time to have stable claudication and no workup  was carried out further.   He underwent coronary artery bypass in February 2009, carried out by Dr.  Tyrone Sage.  Ankle-brachial disease at that time revealed an ABI on the  right of 0.66 and on the left 0.8.  He has longstanding right lower  extremity claudication symptoms.  These, however, worsened recently with  poor exercise tolerance and he has not developed a painful right great  toe and the discomfort at night.   He has noted a deterioration in his walking, previously having walked up  to a mile daily.   MEDICATIONS:  1. Metformin 500 mg b.i.d.  2. Clonidine HCl 0.1 mg 2 tablets daily.  3. Metoprolol 100 mg daily.  4. Fish oil 1000 mg b.i.d.  5. Aspirin 81 mg b.i.d.  6. Simvastatin 40 mg nightly.   ALLERGIES:  LISINOPRIL and ACE INHIBITORS.   PAST MEDICAL HISTORY:  1. Coronary artery disease, status post coronary artery bypass.  2. Type 2 diabetes.  3. Hypertension.  4. History of atrial fibrillation.  5. Dyslipidemia.   PAST SURGICAL HISTORY:  1. Appendectomy.  2. Cholecystectomy.  3. Inguinal hernia repair.   SOCIAL HISTORY:  The patient is  married.  He has two grown children.  He  is a retired Education officer, environmental.  He does not smoke cigarettes or drink  alcohol.  Regular exercise as tolerated.   FAMILY HISTORY:  The patient denies family history of peripheral  arterial disease or stroke.   REVIEW OF SYSTEMS:  The patient notes occasional chest soreness.  Chronic left ankle swelling.   PHYSICAL EXAMINATION:  GENERAL:  Well-appearing 77 year old male.  Alert  and oriented.  VITAL SIGNS:  BP is 125/81, pulse is 63 per minute, respirations 18 per  minute, and temperature 97.5.  NECK:  Supple.  No thyromegaly or adenopathy.  CHEST:  Equal air entry bilaterally without rales or rhonchi.  CARDIOVASCULAR:  No carotid bruits.  Normal heart sounds without  murmurs.  Regular rate and rhythm.  No gallops or rubs.  ABDOMEN:  Soft and nontender.  No masses or organomegaly.  EXTREMITIES:  2+ femoral pulses bilaterally.  Absent popliteal pulse on  the right, 1+ on the left.  No pedal pulses palpable.  SKIN:  Warm, dry, and intact.  No rash or ulceration.  NEUROLOGIC:  Cranial nerves intact.  Normal strength bilaterally.   IMPRESSION:  1. Ischemic rest pain, right lower extremity  2. Coronary artery disease.  3. Type 2 diabetes.  4. Hypertension.  5. Dyslipidemia.   ADMISSION PLAN:  The patient has undergone an arteriogram, which reveals  extensive multilevel peripheral vascular disease in right lower  extremity with occlusion of the right superficial femoral and popliteal  arteries.  Vein mapping unfortunately does not reveal adequate saphenous  vein for bypass due to previous coronary artery bypass.  Arm mapping  reveals small cephalic veins.   Plan at this time is to undergo right lower extremity revascularization  and femoral tibial bypass with prosthetic graft.      Balinda Quails, M.D.  Electronically Signed     PGH/MEDQ  D:  02/04/2008  T:  02/04/2008  Job:  [id number]   cc:   Doreen Beam, MD

## 2010-05-28 NOTE — Progress Notes (Signed)
[facility]                        PERIPHERAL VASCULAR OFFICE NOTE   REECE, FEHNEL                      MRN:          [id number]  DATE:08/02/2007                            DOB:          1929/05/14    PRIMARY CARDIOLOGIST:  Learta Codding, MD, [facility].   PRIMARY CARE PHYSICIAN:  Doreen Beam, MD.   REASON FOR CONSULTATION:  Intermittent claudication.   HISTORY OF PRESENT ILLNESS:  Mr. Ohalloran is a 77 year old gentleman with  coronary and peripheral arterial disease.  He underwent coronary artery  bypass surgery in February 2009.  Preoperatively, he was assessed with  ABIs which by report they were 0.66 on the right and 0.8 on the left.  He reports a longstanding right leg claudication symptoms.  With walking  he develops pain in the foot that radiates up through the right calf.  He denies thigh pain.  He was evaluated by Dr. Madilyn Fireman over a year ago who  recommended observation and followup only if symptoms progress.  He  actually is doing much better with regard to his claudication than he  was at that time.  He now is walking over 2 miles daily.  With that  duration of walking, he stops 2-3 times.  Previously, he had a similar  symptoms at a much lower level of activity.  He has minimal left-sided  symptoms.  He denies hip or buttock claudication on either side.  He has  no history of stroke or TIA.  He has no rest pain.  He denies any  history of lower extremity ulceration.   MEDICATIONS:  1. Lipitor 20 mg daily.  2. Clonidine 0.1 mg twice daily.  3. Metformin 500 mg daily.  4. Aspirin 160 mg twice daily.  5. Fish oil twice daily.  6. Toprol-XL 75 mg daily.   ALLERGIES:  LISINOPRIL.  The patient was given a recent trial of  LISINOPRIL and developed severe angioedema.   PAST MEDICAL HISTORY:  1. CAD status post CABG as outlined.  At that time, he presented with      a non-ST-elevation MI.  2. Type 2 diabetes.  3. Longstanding essential  hypertension.  4. Postoperative atrial fibrillation, currently in sinus rhythm.  5. Remote appendectomy.  6. Cholecystectomy.  7. Nephrolithiasis.  8. Inguinal hernia repair  9. Dyslipidemia.   SOCIAL HISTORY:  The patient is married.  He has 2 grown children.  He  is a retired Education officer, environmental, and also operated a Occupational hygienist for many  years.  He does not smoke cigarettes or drink alcohol.  Regular exercise  as outlined above.   FAMILY HISTORY:  There is no peripheral arterial disease or stroke in  the family.   REVIEW OF SYSTEMS:  Pertinent positives included recent angioedema with  an ACE inhibitor as outlined, postoperative chest soreness, and left  ankle swelling.  All other systems were reviewed and are negative,  except as detailed above.   PHYSICAL EXAMINATION:  GENERAL:  The patient is alert and oriented.  He  is in no acute distress.  He is a very nice gentleman.  VITAL  SIGNS:  Weight is 209 pounds; blood pressure 144/70 on the right,  160/70 on the left; heart rate 68; and respiratory rate 16.  HEENT:  Normal.  NECK:  Normal carotid upstrokes without bruits.  Jugulovenous pressure  is normal.  No thyromegaly or thyroid nodules.  LUNGS:  Clear to auscultation bilaterally.  CARDIAC:  The apex is discrete and nondisplaced.  Heart, regular rate  and rhythm without murmurs or gallops.  ABDOMEN:  Soft, obese, and nontender.  No organomegaly.  No bruits.  EXTREMITIES:  Femoral pulses are 2+, popliteal pulses are 2+, and pedal  pulses are not palpable.  SKIN:  Warm and dry without rash.  There is no clubbing or cyanosis.  There is a trace edema on the left, none on the right. NEUROLOGIC:  Cranial nerves II-XII are intact.  Strength intact and equal  bilaterally.   Studies reviewed as above, ABIs in the mild-to-moderate range at 0.66 on  the right and 0.8 on the left.   Renal ultrasound from July 17 showed normal bilateral kidney size and  poor visualization of the renal  ostia.  The visualized portions of the  renal arteries were normal.   ASSESSMENT:  Intermittent claudication.  The patient has an abnormal  pulse exam and stable claudication symptoms.  He has had an excellent  response to medical therapy and walking program.  No indication for  revascularization at this point.  If he develops progressive symptoms, I  would be happy to see him back in the future at any time.  I do not see  any need for lower extremity duplex at this point as I would not plan on  pursuing revascularization with his mild symptoms.  His medical program  is excellent.  Unfortunately, he was unable to tolerate an ACE  inhibitor, then he would clearly gain benefit from this in the setting  of peripheral arterial disease and coronary artery disease.  The family  requested an increase in his antihypertensive regimen, but they are  concerned about starting any other new medications.  I took the liberty  of increasing his Toprol dose to 100 mg daily.  I advised that this  would likely not be enough to get his blood pressure under ideal  control, but further followup should be done through Dr. Andee Lineman.  There  is no evidence at this point for renal vascular hypertension.  If this  becomes a strong concern, then he would need alternative imaging such as  CT angiography.   I asked Mr. Code to contact me if he has some progressive symptoms,  and I will plan on seeing him back on an as needed basis as above.  I  appreciate the opportunity to participate in the care of this nice  gentleman.     Veverly Fells. Excell Seltzer, MD  Electronically Signed    MDC/MedQ  DD: 08/02/2007  DT: 08/03/2007  Job #: [id number]   cc:   Learta Codding, MD,FACC  Doreen Beam, MD

## 2010-05-28 NOTE — Cardiovascular Report (Signed)
NAME:  MCKENNON, ZWART NO.:  [id number]   MEDICAL RECORD NO.:  [id number]          PATIENT TYPE:  INP   LOCATION:  2926                         FACILITY:  [facility]   PHYSICIAN:  Arturo Morton. Riley Kill, MD, FACCDATE OF BIRTH:  01-May-1929   DATE OF PROCEDURE:  03/05/2007  DATE OF DISCHARGE:                            CARDIAC CATHETERIZATION   INDICATIONS:  Mr. Mcneese is a 77 year old who presents with a non-ST-  elevation MI.  The current study was done to assess coronary anatomy.   PROCEDURE:  1. Left heart catheterization.  2. Selective coronary arteriography.  3. Selective left ventriculography.  4. Subclavian angiography.   DESCRIPTION OF PROCEDURE:  The patient was brought to the  catheterization laboratory and prepped and draped in the usual fashion.  Through an anterior puncture, the right femoral artery was easily  entered.  A 5-French sheath was then placed.  We did a femoral angiogram  to see if he could be a candidate for closure.  It was not ideal.  Following this, views of the left and right coronary arteries were  obtained in multiple angiographic projections.  Subclavian angiography  was then performed in anticipation of possible revascularization  surgery.  Central aortic and left ventricular pressures were measured  with pigtail.  Ventriculography was performed in the RAO projection.  There were no complications.  I discussed the findings with the patient  and subsequently with his family in detail.  The patient was taken to  the holding area in satisfactory clinical condition for sheath removal.  During the course of the case, the patient was given intravenous  labetalol at 20 mg and 10 mg and also a 10 mg intravenous dose of  hydralazine for blood pressure control.  Overall, he tolerated the  procedure well.   ANGIOGRAPHIC DATA:  1. Ventriculography was done in the RAO projection.  Overall systolic      function appeared to be vigorous.  Ejection  fraction was in excess      of 60%.  2. The subclavian demonstrates some irregularity of the left      subclavian vessel with some calcification, but the internal mammary      had vigorous flow and appeared to be widely patent.  3. The right coronary artery is a nondominant vessel.  There is      diffuse 60-70% narrowing in its midportion.  As noted, it is a      nondominant vessel.  4. There is no true left main vessel.  It is a common ostium.  The      circumflex vessel was a dominant vessel.  It is widely patent      proximally and then opens up into a large marginal branch.  The      large marginal branch is segmentally diseased proximally with 70      and then probably 80% narrowing.  Just distal to this is 50%      narrowing.  The distal portion of this vessel goes all the way out      to the apical tip and is a large-caliber  vessel that does appear to      be suitable for grafting.  The AV circumflex just beyond the origin      of the circumflex marginal has what appears to be an ulcerative      lesion.  This would be measured at about 40%.  It does not appear      to be highly obstructive, but somewhat hazy.  The vessel distal to      this opens up.  In the LAO cranial view, the proximal PDA which is      just a modest size vessel appears to have about 70% ostial      narrowing.  The small posterolateral branch has perhaps 40%      narrowing.  5. The left anterior descending artery or common ostium also is      calcified.  This likely represents a left main but again as noted      is very short.  The left anterior descending artery demonstrates      perhaps about 75% narrowing in the first diagonal.  Just after the      first diagonal is the LAD which has probably 70-80% focal narrowing      overlapping the origin of the septal perforator.  The vessel then      opens up and then provides the mid LAD which itself is fairly      heavily calcified.  There is an approximate 80%  stenosis in the mid      LAD which is diffusely diseased.  The proximal portion of the major      second diagonal has about 50% narrowing.  There is probably about      40-50% narrowing distally in the LAD proper.  The apical portion      has a 75% stenosis.   CONCLUSION:  1. Well-preserved left ventricular function.  2. Patent internal mammary.  3. Diffuse calcified disease of the left anterior descending over a      long area, as noted above.  4. Dominant circumflex with high-grade first marginal disease and      moderate atrioventricular circumflex disease.  5. Nondominant right coronary with 60-70% narrowing.   DISPOSITION:  The patient had a non-ST-elevation MI.  It is not quite  apparent what is the infarct-related artery.  There is some haziness in  the mid circumflex.  He has advanced three-vessel disease with heavily  calcified lesions.  Percutaneous intervention would likely require  treatment of a nonoptimal ostial circumflex, long stenting over the mid  LAD which is heavily calcified, and even potentially treatment of the AV  circumflex.  Given the extent of disease, he may well benefit from  revascularization surgery.  I have spoken with Dr. Ofilia Neas who will  see the patient in consultation.  Final disposition will be made then.      Arturo Morton. Riley Kill, MD, [facility]  Electronically Signed     TDS/MEDQ  D:  03/05/2007  T:  03/07/2007  Job:  [id number]   cc:   CV Laboratory  Learta Codding, MD,FACC

## 2010-05-28 NOTE — Assessment & Plan Note (Signed)
OFFICE VISIT   Love, [name]  DOB:  [birth year]                                        December 16, 2007  CHART #:  [id number]   The patient had undergone coronary artery bypass grafting after a  subendocardial myocardial infarction on March 08, 2007.  At that  time, he had right thigh vein harvesting.  The vein below the knee was  too small to use.  Additional vein was harvested from the left thigh  endoscopically.  The patient returns today for followup visit to  evaluate his sternum.  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  At this point, he  continues in cardiac rehabilitation program.  His chief complaint now is  pain in his right foot and toes sometimes at night, especially when he  exercises and early postoperatively, he was walking up to 2 miles a day  without difficulty.  In the past several months, he had to cut this back  to walking just to the car and back and he was unable to walk on the  treadmill in rehabilitation, predominantly the left leg.   PHYSICAL EXAMINATION:  VITAL SIGNS:  His blood pressure is 193/85 on the  left and 166/82 on the right, pulse is 84, respiratory rate is 18, and  O2 sats 95%.  CHEST:  The patient's chest incision is well healed.  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  There is no exposed wire through the skin.  He has  a half-dollar sized hernia at the base of the sternal incision.  LOWER  EXTREMITIES:  He has toes that appear chronically ischemic.  There are  no pedal pulses.  He does have capillary refill in a foot.   Followup chest x-ray was done that shows the wires to be intact.  The  lower sternal wires is rotated and probably pulled out of bone.  Remaining wires appear in position.   IMPRESSION:  1. The patient with nonunion of the sternum, currently asymptomatic.      The patient  is not anxious to have this reoperate on it at this      time.  He does not do heavy work with his arms or upper body and      proposed to continue on the current observation.  2. Progressive ischemic changes in his lower legs.  He has previously      been evaluated by Dr. Madilyn Love for ischemic lower extremities.  We      will arrange for followup of arterial Dopplers of the lower      extremities and to see Dr. Madilyn Love in followup.   [name] Plane, MD  Electronically Signed   EG/MEDQ  D:  12/16/2007  T:  12/16/2007  Job:  [id number]   cc:   [name] Codding, MD,FACC  P. [name] Love, M.D.

## 2010-05-28 NOTE — Assessment & Plan Note (Signed)
OFFICE VISIT   [name], Love  DOB:  1929/10/19                                       01/27/2008  CHART#:[id number]   Since last seen, [name] has undergone a lower extremity arteriogram.  This reveals extensive right lower extremity vascular disease with  occlusion of the superficial femoral and popliteal arteries.  Reconstitution of the posterior tibial and peroneal arteries.  Dominant  runoff to the right foot at the right posterior tibial artery.   He has ongoing ischemic rest pain in his right foot.  This is mildly  cyanotic, no gangrene or ulcer are present at this time.   Blood pressure is 178/82, pulse is 66 per minute.   Vein mapping was carried out today, he has no significant thigh  saphenous vein.  The remaining saphenous vein below the knee is quite  small bilaterally.  Cephalic vein in the arms is borderline in size.   We will plan to go ahead and perform a right femoral posterior tibial  bypass for limb salvage with Propaten Gore-Tex graft.  This is scheduled  for February 04, 2008 at [facility].   Balinda Quails, M.D.  Electronically Signed   PGH/MEDQ  D:  01/27/2008  T:  01/28/2008  Job:  [id number]   cc:   Sheliah Plane, MD

## 2010-05-28 NOTE — Assessment & Plan Note (Signed)
OFFICE VISIT   SALEH, ULBRICH  DOB:  1929-05-22                                        April 29, 2007  CHART #:  [id number]   Mr. Schlabach presented with acute subendocardial myocardial infarction and  underwent subsequent cardiac catheterization by Dr. Riley Kill, and  ultimately underwent coronary artery bypass grafting on February 23.  Postoperatively, he has made good progress.  He is has been complaining  of a dry hacking cough in the mornings and has had some pedal edema.  He  was discharged home on Lasix and this has been continued by Dr. Andee Lineman.  He is walking a mile-and-a-half on the [place], which  his son notes is the most he has walked and almost 2 years.   EXAM:  His blood pressure 137/80, pulse 75, respiratory rate 18, O2 sats  97%.  His upper sternum is stable.  His wound is well-healed.  He does have  some instability of the lower pole of the sternal incision.  There are  no fractured wires evident on x-ray.  He has 2+ pedal edema bilaterally.   Currently he is on metformin 500 mg a day, aspirin 81 mg a day, fish  oil, Lipitor 20 mg a day, Catapres 0.1 mg b.i.d., Toprol XL 50 mg in the  morning and 25 mg in evening.  He has been discontinued from his  amiodarone.  He continues on Ultram and has continued on 30 days of  Lasix 20 mg a day and potassium.   His chest x-ray is without significant effusions.  The sternal wires do  not look displaced.   Overall, the patient is making good progress postoperatively.  He does  have some instability of the lower sternum.  He has been warned to do no  heavy lifting.  I do plan to see me back with a chest x-ray in 8 weeks  to see check the status of his wound and sternum.   Sheliah Plane, MD  Electronically Signed   EG/MEDQ  D:  04/29/2007  T:  04/29/2007  Job:  [id number]   cc:   Learta Codding, MD,FACC  Doreen Beam, MD

## 2010-05-28 NOTE — Assessment & Plan Note (Signed)
OFFICE VISIT   [name] Love, [name] Love  DOB:  04/29/1929                                        July 01, 2007  CHART #:  [id number]   HISTORY OF PRESENT ILLNESS:  [name] Love returns to the office today to  check his sternal incision.  He initially underwent urgent coronary  artery bypass grafting x4 on March 08, 2007.  Postoperatively, he had  some respiratory difficulty, was last seen in the office in April and  had some instability of the lower sternal incision.  He returns today  overall making progress.  He is currently enrolled in cardiac rehab and  his family notes that he is now up to walking 2 miles a day, which is  more than he has walked in more than 3 to 4 years.  He has had no angina  or evidence of congestive heart failure.  He denies any definite sternal  pain or discomfort.   PHYSICAL EXAMINATION:  VITAL SIGNS:  His blood pressure is 168/74, pulse  is 59 and regular, respiratory rate is 24, and O2 sats 98%.  CHEST:  The upper portion of his sternum is stable.  However, the mid  and lower pole has obvious nonunion.  There is no evidence of infection.  EXTREMITIES:  He has no pedal edema.   IMAGING STUDIES:  Followup chest x-ray shows clear lung fields.  There  are no fractured sternal wires.   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  At this point, he is asymptomatic.  I have discussed  the findings with the patient and his wife and son, and at this point,  we will continue to observe it unless he has increasing discomfort, and  then we would consider rewiring the sternum.  The patient is agreeable  with this approach.   PLAN:  I plan to see him back in 6 months with a chest x-ray.   [name] Plane, MD  Electronically Signed   EG/MEDQ  D:  07/01/2007  T:  07/02/2007  Job:  [id number]   cc:   Learta Codding, MD,FACC

## 2010-05-28 NOTE — Discharge Summary (Signed)
NAME:  [name] Love, [name] Love NO.:  [id number]   MEDICAL RECORD NO.:  [id number]          PATIENT TYPE:  INP   LOCATION:  2007                         FACILITY:  [facility]   PHYSICIAN:  Balinda Quails, M.D.    DATE OF BIRTH:  1929-07-22   DATE OF ADMISSION:  02/04/2008  DATE OF DISCHARGE:  02/07/2008                               DISCHARGE SUMMARY   FINAL DISCHARGE DIAGNOSES:  1. Right lower leg ischemia.  2. Peripheral vascular disease.  3. Diabetes.  4. Hypertension.  5. Dyslipidemia.   PROCEDURE PERFORMED:  Right femoral to posterior tibial bypass grafting  with Gore-Tex grafting by Dr. Madilyn Fireman, February 04, 2008.   COMPLICATIONS:  None.   CONDITION ON DISCHARGE:  Stable, improving.   DISCHARGE MEDICATIONS:  1. Metformin 500 mg p.o. b.i.d.  2. Clonidine 0.1 mg p.o. b.i.d.  3. Metoprolol 100 mg p.o. daily.  4. Fish oil 1000 mg p.o. b.i.d.  5. Aspirin 81 mg 2 p.o. daily.  6. Simvastatin 40 mg p.o. at bedtime.  7. He is given a prescription for Percocet 5/325 one p.o. q.4 h.      p.r.n. pain.   DISPOSITION:  He is given careful instructions regarding the care of his  wounds and his activity level.  He is to resume his usual diet.  He is  given an appointment to see Dr. Madilyn Fireman in 2 weeks with ABIs and followup.   BRIEF IDENTIFYING STATEMENT:  For complete details, please refer the  typed history and physical.  Briefly, this very pleasant 77 year old  gentleman was evaluated by Dr. Madilyn Fireman for ischemic pain in his right  foot.  Dr. Madilyn Fireman recommended revascularization.  He was informed of the  risks and benefits of the procedure and after careful consideration he  elected to proceed with surgery.   HOSPITAL COURSE:  Preoperative workup was completed as an outpatient.  He was brought in through Same-Day Surgery and underwent the  aforementioned revascularization procedure.  For complete details,  please refer the typed operative report.  The procedure was without  complications and he was returned to the Postanesthesia Care Unit  extubated.  Following stabilization, he was transferred to a bed on a  Surgical Step-Down Unit.  He was  observed overnight and was able to be transferred to a bed on a Surgical  Convalescent Floor.  His diet and activity level was advanced as  tolerated.  He was walking and improving with physical therapy.  On  February 07, 2008, he was desirous of discharge and was subsequently  discharged home in stable condition.      Wilmon Arms, PA      P. Liliane Bade, M.D.  Electronically Signed    KEL/MEDQ  D:  02/07/2008  T:  02/07/2008  Job:  [id number]

## 2010-05-28 NOTE — Assessment & Plan Note (Signed)
OFFICE VISIT   [name] Love, [name] Love  DOB:  1929/04/18                                       12/30/2007  CHART#:[id number]   The patient is a 77 year old gentleman with known peripheral vascular  disease.  Risk factors include type 2 diabetes, hypertension, coronary  artery disease and hyperlipidemia.   He states he has been evaluated by me 3 or 4 years ago with peripheral  vascular disease.  At that time it seems his symptoms were limited to  claudication.  I do not have records of that in the office at this time.   Since last seen, he has undergone coronary artery bypass carried out by  Dr. Tyrone Sage in February 2009.  Ankle brachial indices carried out at  [facility], in February, revealed a right ABI of 0.66, a left  ABI of 0.8.  He has longstanding right lower extremity claudication  symptoms.  These, however, worsened recently with poor exercise  tolerance and the development of fairly consistent pain in his right  great toe.   Prior to his recent deterioration he was walking up to 1 to 2 miles  daily.   MEDICATIONS:  1. Metformin 500 mg 2 tablets every morning, 1 tablet every evening.  2. Clonidine 0.1 mg twice daily.  3. Metoprolol 100 mg daily.  4. Simvastatin 40 mg nightly.  5. Fish Oil 1000 mg twice daily.  6. Aspirin 81 mg twice daily.   ALLERGIES:  Lisinopril, this caused severe angioedema.   PHYSICAL EXAMINATION:  This 77 year old gentleman appears approximately  his stated age.  Alert and oriented, moderate obesity.  No distress.  BP  is 193/95, pulse 65 per minute, temperature 98.  Lower Extremity:  Reveals intact femoral pulses at 2+ bilaterally.  No popliteal,  posterior tibial or dorsalis pedis pulses.  Right great toe reveals  tenderness and cyanosis.  No ulceration or gangrenous changes.   IMPRESSION:  Early ischemic pain right great toe with recent  deterioration in ankle brachial indices and known peripheral vascular  disease, underlying diabetes.   Will plan arteriography on January 21, 2007, at [facility] with  possible intervention.  Continue aspirin and begin Plavix 75 mg daily  prior to procedure.   Balinda Quails, M.D.  Electronically Signed   PGH/MEDQ  D:  12/30/2007  T:  12/31/2007  Job:  [id number]   cc:   Sheliah Plane, MD  Learta Codding, MD,FACC  Doreen Beam, MD

## 2010-05-28 NOTE — Procedures (Signed)
VASCULAR LAB EXAM   INDICATION:  Vein map prior to right leg bypass graft.  Patient has had  previous coronary artery bypass graft.   HISTORY:  Diabetes:  Yes.  Cardiac:  MI in 2009.  Hypertension:  Yes.   EXAM:  Bilateral cephalic vein and short saphenous vein mapping.   IMPRESSION:  The cephalic veins and short saphenous veins are identified  and measured bilaterally.  Measurements are on the affixed page.  They  are borderline acceptable caliber for use as a bypass graft conduit.   ___________________________________________  P. Liliane Bade, M.D.   MC/MEDQ  D:  01/27/2008  T:  01/28/2008  Job:  [id number]

## 2010-05-28 NOTE — Procedures (Signed)
BYPASS GRAFT EVALUATION   INDICATION:  Followup right femoral/posterior tibial artery bypass  graft.   HISTORY:  Diabetes:  Yes  Cardiac:  CABG  Hypertension:  Yes  Smoking:  No  Previous Surgery:  Right femoral/posterior tibial artery bypass graft  02/04/2008 by Dr. Madilyn Fireman.   SINGLE LEVEL ARTERIAL EXAM                               RIGHT              LEFT  Brachial:                    155                156  Anterior tibial:             149                70  Posterior tibial:            164                94  Peroneal:  Ankle/brachial index:        1.05               0.60   PREVIOUS ABI:  Date: 02/24/2008  RIGHT:  1.05  LEFT:  0.69   LOWER EXTREMITY BYPASS GRAFT DUPLEX EXAM:   DUPLEX:  Doppler arterial waveforms appear biphasic proximal to, within  and distal to bypass graft.  Nonvascularized fluid surrounding bypass graft was noted.   IMPRESSION:  Patent right femoral/posterior tibial artery bypass graft.  ABIs appear stable from previous study.  Nonvascularized fluid surrounding bypass graft noted.  Dr. Arbie Cookey was notified and stated to just keep on protocol.       ___________________________________________  P. Liliane Bade, M.D.   AS/MEDQ  D:  04/21/2008  T:  04/21/2008  Job:  [id number]

## 2010-05-28 NOTE — Procedures (Signed)
BYPASS GRAFT EVALUATION   INDICATION:  Right lower extremity bypass graft.   HISTORY:  Diabetes:  Yes.  Cardiac:  CABG.  Hypertension:  No.  Smoking:  No.  Previous Surgery:  Right femoral to posterior tibial artery bypass graft  on 02/04/2008.   SINGLE LEVEL ARTERIAL EXAM                               RIGHT              LEFT  Brachial:                    158                160  Anterior tibial:             158                87  Posterior tibial:            177                109  Peroneal:  Ankle/brachial index:        1.1                0.68   PREVIOUS ABI:  Date:  04/21/2008  RIGHT:  1.05  LEFT:  0.6   LOWER EXTREMITY BYPASS GRAFT DUPLEX EXAM:   DUPLEX:  Biphasic Doppler waveforms noted throughout the right lower  extremity bypass graft and its native vessels with no significant  increase in velocities noted.   Non-vascularized fluid collection surrounding the proximal to distal  thigh bypass graft appears relatively unchanged from the previous exam.   IMPRESSION:  1. Patent right femoral to posterior tibial artery bypass graft with      no evidence of stenosis.  2. Stable bilateral ankle brachial indices.        ___________________________________________  P. Liliane Bade, M.D.   CH/MEDQ  D:  07/27/2008  T:  07/27/2008  Job:  [id number]

## 2010-05-28 NOTE — Assessment & Plan Note (Signed)
OFFICE VISIT   [name] Love, [name] Love  DOB:  August 31, 1929                                        April 01, 2007  CHART #:  [id number]   [name] Love returns today for a follow-up visit after coronary artery  bypass grafting done on March 08, 2007.  At that time he presented  with a subendocardial myocardial infarction and underwent coronary  artery bypass grafting x4.  He is making reasonable progress  postoperatively.  He has noted some early postoperative cough which is  improving, and some lower extremity edema.  He has had no recurrent  angina.   On exam his blood pressure is 145/69, pulse 74, respiratory rate is 18,  and 02 sat is 96%.  The upper portion of the sternum incision is healed.  He does have a  slight click at the lower pole of this sternal incision.  There is no  evidence of infection.  He has +2 edema in both ankles.  The vein harvest sites are healing well  without evidence of infection.   Follow-up chest x-ray was done that showed slightly smaller effusions,  left slightly greater than the right.  The patient on his preop chest x-  ray had an abnormality in the right apex of the lung.  A CT scan of the  chest the night prior to surgery showed this to be a tortuous  brachiocephalic vessel without other chest or pulmonary abnormalities.  He does have a moderate size hiatal hernia.  No comment on the CT scan  or chest report is made on the kidneys.  The patient tells me today that  while in [place], he had an ultrasound of his kidneys that raised the issue  of a left lower pole renal mass, question of a renal cell carcinoma.  These findings were not communicated with [place] preoperatively.  I have  reviewed his CT scan and the lower pole of the left kidney does not  appear on the chest CT, so I will have to confirm with him that he does  need to have further abdominal imaging as recommended by Dr. Trisha Mangle.  He  is scheduled for an abdominal CT scan next  week.   He continues on metformin 500 daily, aspirin 81 a day, fish oil, Lipitor  20 mg a day, Catapres 0.1 mg b.i.d., Toprol XL 50 mg in the morning and  25 mg in the evening, amiodarone 400 mg a day, Ultram and Diflucan 50 mg  b.i.d.  I have asked the patient to decrease his dose of amiodarone to  200 mg once a day and check with Dr. Andee Lineman at his visit about stopping  it.  I have also given him a prescription for Lasix 20 mg once a day for  30 days and 10 mEq of potassium once a day for 30 days to help in the  decrease of his lower extremity edema.   I plan to see him back in 1 month with a follow-up chest x-ray and  especially in regard to the healing of the lower pole of his sternum.   [name] Plane, MD  Electronically Signed   EG/MEDQ  D:  04/01/2007  T:  04/01/2007  Job:  [id number]   cc:   Learta Codding, MD,FACC  Trisha Mangle, MD

## 2010-05-28 NOTE — Assessment & Plan Note (Signed)
[facility]                          [facility] OFFICE NOTE   EVEREST, BROD                      MRN:          [id number]  DATE:04/26/2007                            DOB:          [birth year]    REFERRING PHYSICIAN:  Dr. Sherril Croon.   HISTORY OF PRESENT ILLNESS:  The patient is a 77 year old male with a  history of non-ST elevation myocardial function.  The patient had  diffuse coronary artery disease and underwent coronary bypass grafting.  He has been doing well.  His exercise tolerance has improved.  He denies  any chest pain, shortness of breath, orthopnea or PND.  The patient does  have a carbuncle looking lesion on his finger.  He has seen Dr. Tyrone Sage  in the meanwhile and was told that his lower wire in his sternotomy is  somewhat loose.  He denies any orthopnea or PND, but he states that  after he takes amiodarone he has significant weakness.  He has not taken  6 weeks of Amiodarone.   MEDICATIONS:  1. Amiodarone 200 p.o. daily.  2. Clonidine 1 tablet two times a day.  3. Lipitor.  4. Metformin.  5. Aspirin.  6. Fish oil.  7. Toprol.  8. Furosemide.  9. Potassium tablets.   PHYSICAL EXAMINATION:  VITAL SIGNS:  Blood pressure 130/76, heart rate  80 beats per minute.  HEENT:  Pupils isochoric.  Conjunctivae clear.  NECK:  Supple.  Normal carotid upstroke.  SKIN:  Somewhat jaundiced looking.  LUNGS:  Clear breath sounds bilaterally.  HEART:  Regular rate and rhythm.  Normal S1-S2.  ABDOMEN:  Soft, nontender.  No rebound or guarding.  Good bowel sounds.  EXTREMITIES:  No cyanosis, clubbing or edema.  NEURO:  Patient alert and oriented and grossly nonfocal.   PROBLEMS:  1. Status post coronary artery bypass graft.  2. Normal left ventricular systolic function.  3. Rule out jaundice.  4. Rule out hepatomegaly  5. Rule out methicillin resistant Staphylococcus aureus skin infection      of the finger.   PLAN:  1. From a  cardiovascular standpoint, the patient is actually doing      quite well.  His EKG shows normal sinus rhythm.  There is no      evidence of significant bradycardia.  2. The patient states that he becomes very weak after he takes      amiodarone.  He does have the appearance of jaundice.  We will      check his liver function tests and TSH.  3. The patient also could have side-effects second to clonidine, and      will monitor this, but his vital signs are stable.  4. Laboratory work will be obtained, including CBC, CMET and TSH.  5. The patient will be referred to the dermatologist to rule out      possible bacterial skin infection.  I have recommended Bactroban in      the meanwhile with dry dressings.  6. The patient will follow up me in the next couple of months.     Vernie Shanks  DeGent, MD,FACC  Electronically Signed    GED/MedQ  DD: 04/26/2007  DT: 04/26/2007  Job #: [id number]   cc:   Doreen Beam, MD

## 2010-05-28 NOTE — Discharge Summary (Signed)
NAME:  [name] Love, [name] Love NO.:  [id number]   MEDICAL RECORD NO.:  [id number]          PATIENT TYPE:  INP   LOCATION:  2039                         FACILITY:  [facility]   PHYSICIAN:  Sheliah Plane, MD    DATE OF BIRTH:  11-18-1929   DATE OF ADMISSION:  03/04/2007  DATE OF DISCHARGE:  03/13/2007                               DISCHARGE SUMMARY   HISTORY OF PRESENT ILLNESS:  The patient is a 77 year old gentleman who  had been having a stuttering substernal chest pressure radiating to his  hands for approximately one week.  He first noted it on the Sunday prior  to admission.  When he would sit down and rest, the symptoms would  improve.  He denied any diaphoresis, nausea or vomiting.  He had an  episode on the Wednesday prior to admission with very similar symptoms  and ultimately he was seen at Dr. Bonnita Levan office on Thursday and referred  to [facility] and admitted directly to be managed by Dr. Andee Lineman.  The patient did rule in for a non-ST elevation myocardial infarction  with a peak CK of 164 with an MB of 11 and a troponin of 3.75.  He was  transferred to [facility] for further evaluation and treatment  to include cardiac catheterization.   CARDIAC RISK FACTORS:  1. Long-standing hypertension.  2. Lipid status unknown.  3. History of recently diagnosed diabetes, approximately 8 months ago.   PAST SURGICAL HISTORY:  1. Appendectomy, age 50.  2. Cholecystectomy.  3. Kidney stones.  4. Hernia repair.   MEDICATIONS:  Prior to admission,  1. Metformin 500 mg daily.  2. Diclofenac 50 mg daily.  3. Aspirin 81 mg twice daily.  4. Atenolol 50 mg every morning and 25 mg in the p.m.  5. Fish oil daily dosage not listed.  6. Plavix given in [place] at time of presentation to [facility].   ALLERGIES:  1. ACE INHIBITORS.  2. ARBs.  These have caused angioedema.   FAMILY HISTORY:  Please see the history and physical done at the time of  admission.   SOCIAL HISTORY:  Please see the history and physical done at the time of  admission.   REVIEW OF SYMPTOMS:  Please see the history and physical done at the  time of admission.   PHYSICAL EXAMINATION:  Please see the history and physical done at the  time of admission.   HOSPITAL COURSE:  The patient was admitted in transfer from [facility] on March 05, 2007.  A cardiac catheterization was scheduled  and undertaken on March 13, 2007.  The findings included severely  calcified and diseased LAD, severe OM lesion, a circumflex with  significant lesion as well.  Due to these findings a surgical  consultation was obtained with Sheliah Plane, M.D. to evaluate the  patient's studies and agreed with recommendations to proceed with  surgical revascularization.  The patient was felt to be medically stable  to proceed and on March 08, 2007, the patient underwent the following  procedure, coronary artery bypass grafting x4.  The following  grafts  were placed:  1.  Left internal mammary artery to the LAD.  2.  Saphenous vein graft to the obtuse marginal.  3.  Saphenous vein graft  to the distal circumflex.  4.  Saphenous vein graft to the diagonal.  Additionally, there was a finding of an enlarged mammary lymph nodes  that was biopsied.  The patient tolerated this procedure well and was  taken to the surgical intensive care unit in a stable condition.   POSTOPERATIVE HOSPITAL COURSE:  Overall, the patient has done quite  well.  He has remained hemodynamically stable.  He was weaned from  inotropic support without significant difficulty.  He was weaned from  the ventilator without difficulty.  He did have postoperative atrial  fibrillation and has been chemically cardioverted to normal sinus rhythm  with amiodarone and beta-blockade.  The patient has progressed nicely in  regards to his routine cardiac rehabilitation modalities.  His incisions  are healing well without evidence  of infection.  He does have a moderate postoperative acute blood loss anemia.  His  values are stable.  Most recent hemoglobin and hematocrit dated March 11, 2007, are 9.3 and  27, respectively.  Electrolytes, BUN, and  creatinine are within normal limits.  Overall, his status is felt to be quite stable for discharge on  postoperative day five.   CONDITION ON DISCHARGE:  Stable and improving.   DISCHARGE MEDICATIONS:  1. Metformin 500 mg p.o. daily.  2. Aspirin 81 mg two tablets daily.  3. Fish oil, he is to resume his home dose.  4. Lasix 40 mg daily for 7 days.  5. K-Dur 20 mEq daily for 7 days.  6. Lipitor 20 mg daily.  7. Catapres 0.1 mg twice daily p.o.  8. Toprol XL 75 mg daily.  9. Amiodarone 400 mg twice daily for an additional 7 days, then once      daily.  10.For pain, Ultram 50 mg one q.6 h. as needed.   FINAL DIAGNOSES:  1. Severe multivessel coronary artery disease as described with non-ST      elevation myocardial infarction on presentation, now status post      surgical revascularization as described.  2. Postoperative acute blood loss anemia.  3. Postoperative atrial fibrillation.  4. Long-standing hypertension.  5. Diabetes mellitus, type 2.  6. Appendectomy, age 32.  7. Cholecystectomy.  8. Kidney stones.  9. Hernia repair.   CONDITION ON DISCHARGE:  Stable and improving.   FOLLOWUP:  The patient is instructed to follow up with Dr. Andee Lineman in 2  weeks at his cardiology office.  Additionally, he will see Dr. Tyrone Sage  on April 01, 2007 at 3 p.m. with a chest x-ray at that time.      Rowe Clack, P.A.-C.      Sheliah Plane, MD  Electronically Signed    WEG/MEDQ  D:  03/13/2007  T:  03/14/2007  Job:  [id number]   cc:   Learta Codding, MD,FACC  Doreen Beam, MD

## 2010-05-28 NOTE — Assessment & Plan Note (Signed)
OFFICE VISIT   KEANTHONY, POOLE  DOB:  1929-05-15                                        June 22, 2008  CHART #:  [id number]   The patient returns to the office now, 6 months after his last visit.  He had had originally undergone coronary artery bypass grafting in  February 2009.  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  On his last visit, he was  complaining of increasing claudication pain in both lower legs, but  right greater than left.  Since I have last seen him, he has underwent a  right femoropopliteal bypass with Gore-Tex and notes marked improvement  in his ability to walk.  Noting that now his left leg is the slow leg.  He has had no recurrent angina.  His sternum remains with that nonunion,  but he has had very minimal symptoms and is not interfering with his  current lifestyle.   On exam, his blood pressure is 153/78, pulse 68, respiratory rate is 18,  and O2 sat is 97%.  On physical exam, he continues to have nonunion of  the sternum with the incision in muscular layers intact.  He has some  lower extremity edema, right slightly greater than left.  He notes this  is improving.  He has an easily palpable posterior tibial pulse on the  right.   Followup chest x-ray is unchanged from the film 6 months ago with lower  two sternal wires displaced.   Overall, the patient is stable.  I have discussed with him rewiring his  sternum should he have any symptoms, discomfort, or limitation from it.  At this point, we will continue with nonoperative treatment.  I have not  made him a return appointment to see me, but discussed with him and his  son and wife that should he have any change in symptoms or appearance of  the incision to contact us immediately.  We can reconsider sternal  rewiring.   Sheliah Plane, MD  Electronically Signed   EG/MEDQ  D:  06/22/2008  T:  06/22/2008  Job:   [id number]   cc:   Bevelyn Buckles. Bensimhon, MD

## 2010-06-12 ENCOUNTER — Ambulatory Visit: Payer: Self-pay | Admitting: Cardiology

## 2010-06-13 ENCOUNTER — Encounter: Payer: Self-pay | Admitting: Cardiology

## 2010-06-13 ENCOUNTER — Ambulatory Visit (INDEPENDENT_AMBULATORY_CARE_PROVIDER_SITE_OTHER): Payer: Medicare Other | Admitting: Cardiology

## 2010-06-13 DIAGNOSIS — I739 Peripheral vascular disease, unspecified: Secondary | ICD-10-CM

## 2010-06-13 DIAGNOSIS — I4821 Permanent atrial fibrillation: Secondary | ICD-10-CM

## 2010-06-13 DIAGNOSIS — N289 Disorder of kidney and ureter, unspecified: Secondary | ICD-10-CM

## 2010-06-13 DIAGNOSIS — Z79899 Other long term (current) drug therapy: Secondary | ICD-10-CM

## 2010-06-13 DIAGNOSIS — I25810 Atherosclerosis of coronary artery bypass graft(s) without angina pectoris: Secondary | ICD-10-CM

## 2010-06-13 DIAGNOSIS — N259 Disorder resulting from impaired renal tubular function, unspecified: Secondary | ICD-10-CM

## 2010-06-13 DIAGNOSIS — I4891 Unspecified atrial fibrillation: Secondary | ICD-10-CM

## 2010-06-13 MED ORDER — RIVAROXABAN 15 MG PO TABS: 15.0000 mg | ORAL_TABLET | Freq: Every day | ORAL | Status: DC

## 2010-06-13 NOTE — Assessment & Plan Note (Signed)
GFR is 43 mL per minute and in the setting of the use of a anti-10 A. inhibitor we will have her followup electrolyte panel done in one month

## 2010-06-13 NOTE — Assessment & Plan Note (Signed)
The patient is now status post right below the knee amputation. Is doing quite well and his pain has improved significantly. He also had revascularization of the right femoral artery to preserve most of his leg. He has some phantom pain in the right leg but this is slowly improving.

## 2010-06-13 NOTE — Assessment & Plan Note (Signed)
The patient now appears to be in permanent atrial fibrillation. I had a long discussion with him that his risk for stroke in the setting of his risk factors is quite high. The patient does not want to take Coumadin however which is reasonable given the fact that his INRs were difficult to control. Although Plavix may be providing some additional protection against acute coronary syndrome the patient is status post bypass grafting and has no stents. I told him that his highest risk is the risk of stroke and that he should go on anticoagulation. He has agreed to start Xarelto. However he has underlying renal insufficiency and a GFR is 43 mL per minute. I've written a prescription for 15 mg by mouth daily. We'll also followup his renal function in one month. The patient states that he will give this a try and his son will check with the insurance company whether they can continue to afford this medication.

## 2010-06-13 NOTE — Progress Notes (Signed)
HPI The patient had a lipid panel done in February of 2012. This was within normal limits with a mildly elevated alkaline phosphatase for which she was referred to his primary care physician. The patient is also followed by [facility]. It appears that a polyclonal gammopathy and a positive ANA in a speckled pattern. The patient is on chronic Coumadin therapy anymore. He stopped this on his own because he felt that he was always cold and his INR was difficult to control. He has not resumed it since his below the knee amputation. He has been taken however combination of aspirin and Plavix. The patient has history of peripheral vascular disease and is followed at [facility] for this. The patient had prior right femoral posterior tibial artery bypass grafting done which has been occluded. The patient in the interim has transferred his care to [facility]. He apparently was told that he couldn't save his leg if there were able to fix his proximal arterial occlusion. Apparently he did have repair but did require low knee amputation. The patient has a prosthesis. His leg pain has improved although he has some phantom leg pain. The patient has a history of paroxysmal atrial fibrillation, hypertension, peripheral vascular disease and diabetes mellitus. He has a history of coronary artery disease and status post coronary bypass grafting. He has significant baseline hypoxemia and nasal oxygen therapy at home. He does have normal LV function albeit with a dilated right ventricle and moderate tricuspid regurgitation. From a cardiac standpoint the patient is doing well. He reports no chest pain shortness of breath orthopnea PND he has no palpitations or syncope.  Allergies  Allergen Reactions  . Ace Inhibitors     REACTION: angioedema  . Angiotensin Receptor Blockers     REACTION: angioedema    Current Outpatient Prescriptions on File Prior to Visit  Medication Sig Dispense Refill  . aspirin 81  MG tablet Take 81 mg by mouth daily.        . cloNIDine (CATAPRES) 0.2 MG tablet Take 0.2 mg by mouth 2 (two) times daily.       . fish oil-omega-3 fatty acids 1000 MG capsule Take 1 g by mouth 2 (two) times daily.       . furosemide (LASIX) 40 MG tablet Take 40 mg by mouth 2 (two) times a week.       Marland Kitchen glimepiride (AMARYL) 2 MG tablet Take 2 mg by mouth daily before breakfast.        . metoprolol (TOPROL-XL) 100 MG 24 hr tablet Take 50 mg by mouth daily.       . simvastatin (ZOCOR) 40 MG tablet Take 40 mg by mouth at bedtime.        Marland Kitchen DISCONTD: clopidogrel (PLAVIX) 75 MG tablet Take 75 mg by mouth daily.        Marland Kitchen DISCONTD: warfarin (COUMADIN) 2.5 MG tablet Take 2.5 mg by mouth daily.          Past Medical History  Diagnosis Date  . Coronary artery disease   . Arrhythmia     post op atrial fibrillation,treated with amiodarone  . Dyslipidemia   . Diabetes mellitus     type 2  . Hypertension   . Renal insufficiency   . Hypoalbuminemia   . Obesity     lungs pending essential hypertension    Past Surgical History  Procedure Date  . Coronary artery bypass graft     coronary artery bypass graft 02/2007 non st myocardial  infarction 4 vessel  . Inguinal hernia repair   . Appendectomy   . Cholecystectomy   . Vascular surgery     PVD Dr.Greg Madilyn Fireman right femto pt bypass with gortex 02/04/2008 occluded by doppler 08/2008    No family history on file.  History   Social History  . Marital Status: Married    Spouse Name: N/A    Number of Children: N/A  . Years of Education: N/A   Occupational History  . 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 Smoking status: Never Smoker   . Smokeless tobacco: Never Used  . Alcohol Use: No  . Drug Use: No  . Sexually Active: Not on file   Other Topics Concern  . Not on file   Social History Narrative  . No narrative on file   [id number] positives as outlined above. The  remainder of the 18  point review of systems is negative   PHYSICAL EXAM BP 154/82  Pulse 69  Ht 6\' 1"  (1.854 m)  Wt 189 lb (85.73 kg)  BMI 24.94 kg/m2  General: Well-developed, well-nourished in no distress Head: Normocephalic and atraumatic Eyes:PERRLA/EOMI intact, conjunctiva and lids normal Ears: No deformity or lesions Mouth:normal dentition, normal posterior pharynx Neck: Supple, no JVD.  No masses, thyromegaly or abnormal cervical nodes Lungs: Normal breath sounds bilaterally without wheezing.  Normal percussion Cardiac:irregular rate and rhythm with normal S1 and S2, no S3 or S4.  PMI is normal.  No pathological murmurs Abdomen: Normal bowel sounds, abdomen is soft and nontender without masses, organomegaly or hernias noted.  No hepatosplenomegaly MSK: Back normal, normal gait muscle strength and tone normal Vascular: Pulse is normal in all 4 extremities Extremities: Status post below right knee amputation  Neurologic: Alert and oriented x 3 Skin: Intact without lesions or rashes Lymphatics: No significant adenopathy Psychologic: Normal affect  ECG: Atrial fibrillation with rate control heart rate 61 beats per minute no acute ischemic changes  ASSESSMENT AND PLAN

## 2010-06-13 NOTE — Assessment & Plan Note (Signed)
No recurrent chest pain the patient is stable from this perspective. No further ischemia workup is needed. He was evaluated by cardiology during his recent hospitalization for right below the knee amputation. His preoperative course was not complicated by heart failure or acute coronary syndrome.

## 2010-06-13 NOTE — Patient Instructions (Signed)
Your physician recommends that you go to the [facility] for lab work in 1 month for [employer]. If the results of your test are normal or stable, you will receive a letter.  If they are abnormal, the nurse will contact you by phone. Stop Plavix Begin Xarelto 15mg  daily Your physician wants you to follow up in: 6 months.  You will receive a reminder letter in the mail one-two months in advance.  If you don't receive a letter, please call our office to schedule the follow up appointment

## 2010-09-12 DIAGNOSIS — I5022 Chronic systolic (congestive) heart failure: Secondary | ICD-10-CM

## 2010-09-13 DIAGNOSIS — I5022 Chronic systolic (congestive) heart failure: Secondary | ICD-10-CM

## 2010-10-07 LAB — POCT I-STAT 3, ART BLOOD GAS (G3+)
Acid-Base Excess: 1
Acid-base deficit: 2
Acid-base deficit: 6 — ABNORMAL HIGH
Bicarbonate: 19 — ABNORMAL LOW
Bicarbonate: 24.8 — ABNORMAL HIGH
Operator id: 271091
Operator id: 277551
Operator id: 3342
Operator id: 3342
Patient temperature: 36
pCO2 arterial: 36.7
pCO2 arterial: 38.7
pH, Arterial: 7.362
pO2, Arterial: 230 — ABNORMAL HIGH
pO2, Arterial: 85

## 2010-10-07 LAB — TSH: TSH: 2.267

## 2010-10-07 LAB — CBC
HCT: 25.9 — ABNORMAL LOW
HCT: 27 — ABNORMAL LOW
HCT: 27.1 — ABNORMAL LOW
HCT: 27.5 — ABNORMAL LOW
HCT: 28.8 — ABNORMAL LOW
HCT: 29.1 — ABNORMAL LOW
HCT: 34 — ABNORMAL LOW
HCT: 39.6
HCT: 40.5
Hemoglobin: 11.4 — ABNORMAL LOW
Hemoglobin: 13.2
Hemoglobin: 13.4
Hemoglobin: 13.4
Hemoglobin: 8.7 — ABNORMAL LOW
Hemoglobin: 9.1 — ABNORMAL LOW
Hemoglobin: 9.2 — ABNORMAL LOW
Hemoglobin: 9.3 — ABNORMAL LOW
Hemoglobin: 9.7 — ABNORMAL LOW
Hemoglobin: 9.8 — ABNORMAL LOW
MCHC: 33.3
MCHC: 33.3
MCHC: 33.5
MCHC: 33.5
MCHC: 33.5
MCHC: 33.5
MCHC: 33.7
MCHC: 33.9
MCHC: 34.3
MCV: 90.3
MCV: 90.3
MCV: 90.3
MCV: 90.4
MCV: 90.4
MCV: 90.5
MCV: 90.7
MCV: 90.8
MCV: 91
MCV: 91
Platelets: 159
Platelets: 177
Platelets: 177
Platelets: 198
Platelets: 204
Platelets: 207
Platelets: 308
RBC: 2.87 — ABNORMAL LOW
RBC: 2.97 — ABNORMAL LOW
RBC: 2.98 — ABNORMAL LOW
RBC: 3.05 — ABNORMAL LOW
RBC: 3.17 — ABNORMAL LOW
RBC: 3.23 — ABNORMAL LOW
RBC: 3.76 — ABNORMAL LOW
RBC: 4.38
RBC: 4.38
RBC: 4.48
RDW: 13.1
RDW: 13.4
RDW: 13.4
RDW: 13.4
RDW: 13.5
RDW: 13.5
RDW: 13.6
WBC: 11.1 — ABNORMAL HIGH
WBC: 11.1 — ABNORMAL HIGH
WBC: 12.4 — ABNORMAL HIGH
WBC: 14.6 — ABNORMAL HIGH
WBC: 15.2 — ABNORMAL HIGH
WBC: 15.3 — ABNORMAL HIGH
WBC: 15.6 — ABNORMAL HIGH
WBC: 16.1 — ABNORMAL HIGH
WBC: 16.3 — ABNORMAL HIGH

## 2010-10-07 LAB — POCT I-STAT 4, (NA,K, GLUC, HGB,HCT)
Glucose, Bld: 110 — ABNORMAL HIGH
Glucose, Bld: 119 — ABNORMAL HIGH
Glucose, Bld: 132 — ABNORMAL HIGH
Glucose, Bld: 98
HCT: 23 — ABNORMAL LOW
HCT: 31 — ABNORMAL LOW
HCT: 34 — ABNORMAL LOW
Hemoglobin: 10.5 — ABNORMAL LOW
Hemoglobin: 7.8 — CL
Operator id: 3342
Potassium: 4.3
Potassium: 4.5
Sodium: 136

## 2010-10-07 LAB — I-STAT EC8
Acid-base deficit: 5 — ABNORMAL HIGH
Chloride: 104
Glucose, Bld: 152 — ABNORMAL HIGH
TCO2: 21
pCO2 arterial: 35.3
pH, Arterial: 7.351

## 2010-10-07 LAB — BASIC METABOLIC PANEL
BUN: 16
BUN: 19
BUN: 20
BUN: 22
CO2: 26
CO2: 27
CO2: 27
CO2: 28
CO2: 28
CO2: 31
Calcium: 7.9 — ABNORMAL LOW
Calcium: 8 — ABNORMAL LOW
Calcium: 8.1 — ABNORMAL LOW
Calcium: 8.2 — ABNORMAL LOW
Calcium: 8.2 — ABNORMAL LOW
Calcium: 8.4
Chloride: 101
Chloride: 101
Chloride: 102
Chloride: 103
Chloride: 97
Chloride: 97
Chloride: 97
Creatinine, Ser: 1.23
Creatinine, Ser: 1.25
Creatinine, Ser: 1.32
Creatinine, Ser: 1.52 — ABNORMAL HIGH
GFR calc Af Amer: 54 — ABNORMAL LOW
GFR calc Af Amer: >60
GFR calc Af Amer: >60
GFR calc Af Amer: >60
GFR calc Af Amer: >60
GFR calc Af Amer: >60
GFR calc Af Amer: >60
GFR calc non Af Amer: 45 — ABNORMAL LOW
GFR calc non Af Amer: 53 — ABNORMAL LOW
GFR calc non Af Amer: 56 — ABNORMAL LOW
GFR calc non Af Amer: 57 — ABNORMAL LOW
GFR calc non Af Amer: >60
Glucose, Bld: 107 — ABNORMAL HIGH
Glucose, Bld: 114 — ABNORMAL HIGH
Glucose, Bld: 132 — ABNORMAL HIGH
Glucose, Bld: 155 — ABNORMAL HIGH
Potassium: 3.7
Potassium: 3.8
Potassium: 4
Potassium: 4
Potassium: 4.1
Potassium: 4.1
Potassium: 4.2
Sodium: 132 — ABNORMAL LOW
Sodium: 133 — ABNORMAL LOW
Sodium: 134 — ABNORMAL LOW
Sodium: 134 — ABNORMAL LOW
Sodium: 134 — ABNORMAL LOW
Sodium: 135
Sodium: 136

## 2010-10-07 LAB — CROSSMATCH
ABO/RH(D): O POS
Antibody Screen: NEGATIVE

## 2010-10-07 LAB — I-STAT 8, (EC8 V) (CONVERTED LAB)
Acid-base deficit: 1
Chloride: 98
Glucose, Bld: 158 — ABNORMAL HIGH
Hemoglobin: 10.2 — ABNORMAL LOW
Potassium: 4.1
Sodium: 134 — ABNORMAL LOW
TCO2: 28
pH, Ven: 7.307 — ABNORMAL HIGH

## 2010-10-07 LAB — POCT I-STAT GLUCOSE
Glucose, Bld: 104 — ABNORMAL HIGH
Glucose, Bld: 124 — ABNORMAL HIGH
Operator id: 3342
Operator id: 3342

## 2010-10-07 LAB — POCT I-STAT 3, VENOUS BLOOD GAS (G3P V)
O2 Saturation: 88
pCO2, Ven: 37.9 — ABNORMAL LOW
pH, Ven: 7.41 — ABNORMAL HIGH

## 2010-10-07 LAB — CREATININE, SERUM
Creatinine, Ser: 1.04
GFR calc Af Amer: >60
GFR calc non Af Amer: >60

## 2010-10-07 LAB — CK TOTAL AND CKMB (NOT AT ARMC)
CK, MB: 20 — ABNORMAL HIGH
Relative Index: 9.8 — ABNORMAL HIGH
Total CK: 205

## 2010-10-07 LAB — CARDIAC PANEL(CRET KIN+CKTOT+MB+TROPI)
CK, MB: 11 — ABNORMAL HIGH
CK, MB: 7.5 — ABNORMAL HIGH
Relative Index: 6.7 — ABNORMAL HIGH
Relative Index: 7.3 — ABNORMAL HIGH
Troponin I: 3.27
Troponin I: 3.75

## 2010-10-07 LAB — BLOOD GAS, ARTERIAL
Acid-base deficit: 1.1
Bicarbonate: 22.7
Drawn by: 277331
O2 Saturation: 96.2
Patient temperature: 98.6
TCO2: 23.8
pCO2 arterial: 35.8
pH, Arterial: 7.419
pO2, Arterial: 94

## 2010-10-07 LAB — URINALYSIS, ROUTINE W REFLEX MICROSCOPIC
Bilirubin Urine: NEGATIVE
Glucose, UA: NEGATIVE
Hgb urine dipstick: NEGATIVE
Ketones, ur: NEGATIVE
Nitrite: NEGATIVE
Protein, ur: NEGATIVE
Specific Gravity, Urine: 1.018
Urobilinogen, UA: 1
pH: 5

## 2010-10-07 LAB — COMPREHENSIVE METABOLIC PANEL
Alkaline Phosphatase: 136 — ABNORMAL HIGH
BUN: 18
Chloride: 103
Creatinine, Ser: 1.17
Glucose, Bld: 134 — ABNORMAL HIGH
Potassium: 4.7
Total Bilirubin: 1.4 — ABNORMAL HIGH

## 2010-10-07 LAB — MAGNESIUM
Magnesium: 2.4
Magnesium: 2.4
Magnesium: 2.7 — ABNORMAL HIGH

## 2010-10-07 LAB — BLEEDING TIME: Bleeding Time: 5.5

## 2010-10-07 LAB — HEMOGLOBIN A1C
Hgb A1c MFr Bld: 7.2 — ABNORMAL HIGH
Mean Plasma Glucose: 179

## 2010-10-07 LAB — APTT
aPTT: 36
aPTT: 40 — ABNORMAL HIGH

## 2010-10-07 LAB — T4: T4, Total: 7.2

## 2010-10-07 LAB — PROTIME-INR
INR: 1.3
Prothrombin Time: 16.9 — ABNORMAL HIGH

## 2010-10-07 LAB — PLATELET COUNT: Platelets: 198

## 2010-10-07 LAB — ABO/RH: ABO/RH(D): O POS

## 2010-10-07 LAB — T3: T3, Total: 73.4 — ABNORMAL LOW (ref 80.0–204.0)

## 2010-10-07 LAB — LIPID PANEL: Cholesterol: 115

## 2010-10-07 LAB — HEMOGLOBIN AND HEMATOCRIT, BLOOD: HCT: 24.1 — ABNORMAL LOW

## 2010-10-08 ENCOUNTER — Ambulatory Visit (INDEPENDENT_AMBULATORY_CARE_PROVIDER_SITE_OTHER): Payer: Medicare Other | Admitting: Cardiology

## 2010-10-08 ENCOUNTER — Encounter: Payer: Self-pay | Admitting: Cardiology

## 2010-10-08 VITALS — BP 137/73 | HR 62 | Ht 73.0 in | Wt 205.0 lb

## 2010-10-08 DIAGNOSIS — I25810 Atherosclerosis of coronary artery bypass graft(s) without angina pectoris: Secondary | ICD-10-CM

## 2010-10-08 DIAGNOSIS — N289 Disorder of kidney and ureter, unspecified: Secondary | ICD-10-CM

## 2010-10-08 DIAGNOSIS — I4891 Unspecified atrial fibrillation: Secondary | ICD-10-CM

## 2010-10-08 DIAGNOSIS — I2510 Atherosclerotic heart disease of native coronary artery without angina pectoris: Secondary | ICD-10-CM

## 2010-10-08 DIAGNOSIS — I4821 Permanent atrial fibrillation: Secondary | ICD-10-CM

## 2010-10-08 DIAGNOSIS — I739 Peripheral vascular disease, unspecified: Secondary | ICD-10-CM

## 2010-10-08 DIAGNOSIS — R609 Edema, unspecified: Secondary | ICD-10-CM

## 2010-10-08 DIAGNOSIS — Z89519 Acquired absence of unspecified leg below knee: Secondary | ICD-10-CM

## 2010-10-08 NOTE — Assessment & Plan Note (Addendum)
The patient remains in permanent atrial fibrillation. Heart rate is controlled. The patient is on anticoagulation with Xarelto. He has declined Coumadin in the past.

## 2010-10-08 NOTE — Assessment & Plan Note (Signed)
Creatinine clearance stable at 39 mL per minute. Follow BMET closely due to the fact the patient on Xarelto

## 2010-10-08 NOTE — Assessment & Plan Note (Signed)
Status post failed and popliteal bypass graft. No definite vascular insufficiency left lower extremity.

## 2010-10-08 NOTE — Assessment & Plan Note (Signed)
Phantom leg pain

## 2010-10-08 NOTE — Progress Notes (Signed)
HPI The patient is a 77 year old male with a complex medical history, which includes renal sufficiency with polyclonal gammopathy and a positive ANA in a speckled pattern. The patient has ischemic heart disease and is status post coronary bypass grafting in 2009 as well as status post vascular surgery with failed right femoral-popliteal bypass graft ultimately requiring BKA in the right lower extremity. The patient has a history of paroxysmal atrial fibrillation, hypertension as well as diabetes mellitus. He has been placed on Xarelto is tolerating this well. His recent creatinine clearance was 39 mL per minute with adjusted dose of Xarelto 15 milligrams by mouth daily. The patient reports no complications. He was recently hospitalized with volume overload and cough. The patient had an echocardiogram done which showed an ejection fraction of 50-55%. The right ventricle slightly dilated but RV function normal. Laboratory work from a week ago showed a GFR of 39 mL per minute with a creatinine of 2 and a potassium that was stable at 3.7. The patient presents for followup today. He states that he is doing well. He still has some left lower extremity edema but this is significantly less short of breath. The patient still complains of phantom pain in the right leg.  Allergies  Allergen Reactions  . Ace Inhibitors     REACTION: angioedema  . Angiotensin Receptor Blockers     REACTION: angioedema    Current Outpatient Prescriptions on File Prior to Visit  Medication Sig Dispense Refill  . amLODipine (NORVASC) 5 MG tablet Take 1 tablet by mouth Daily.      Marland Kitchen aspirin 81 MG tablet Take 81 mg by mouth daily.        . cloNIDine (CATAPRES) 0.2 MG tablet Take 0.2 mg by mouth 2 (two) times daily.       . fish oil-omega-3 fatty acids 1000 MG capsule Take 1 g by mouth 2 (two) times daily.       . furosemide (LASIX) 40 MG tablet Take 40 mg by mouth daily.       Marland Kitchen glimepiride (AMARYL) 2 MG tablet Take 2 mg by  mouth daily before breakfast.        . metoprolol (TOPROL-XL) 100 MG 24 hr tablet Take 50 mg by mouth daily.       . Rivaroxaban (XARELTO) 15 MG TABS tablet Take 1 tablet (15 mg total) by mouth daily.  30 tablet  6  . simvastatin (ZOCOR) 40 MG tablet Take 40 mg by mouth at bedtime.          Past Medical History  Diagnosis Date  . Coronary artery disease     Status post coronary bypass grafting February 2009  . Arrhythmia     post op atrial fibrillation,treated with amiodarone  . Dyslipidemia   . Diabetes mellitus     type 2  . Hypertension   . Renal insufficiency     GFR 43 meals per minute April 2012  . Hypoalbuminemia   . Obesity     lungs pending essential hypertension  . Amputation of leg     Status post right BKA  . Peripheral vascular disease     Followed at [facility]  . Phantom limb pain     Past Surgical History  Procedure Date  . Coronary artery bypass graft     coronary artery bypass graft 02/2007 non st myocardial infarction 4 vessel  . Inguinal hernia repair   . Appendectomy   . Cholecystectomy   . Vascular surgery  PVD Dr.Greg Madilyn Fireman right femto pt bypass with gortex 02/04/2008 occluded by doppler 08/2008    No family history on file.  History   Social History  . Marital Status: Married    Spouse Name: N/A    Number of Children: N/A  . Years of Education: N/A   Occupational History  . 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 Smoking status: Never Smoker   . Smokeless tobacco: Never Used  . Alcohol Use: No  . Drug Use: No  . Sexually Active: Not on file   Other Topics Concern  . Not on file   Social History Narrative  . No narrative on file   [id number] positives as outlined above. The remainder of the 18  point review of systems is negative  PHYSICAL EXAM BP 137/73  Pulse 62  Ht 6\' 1"  (1.854 m)  Wt 205 lb (92.987 kg)  BMI 27.05 kg/m2  General: Well-developed,  well-nourished in no distress Head: Normocephalic and atraumatic Eyes:PERRLA/EOMI intact, conjunctiva and lids normal Ears: No deformity or lesions Mouth:normal dentition, normal posterior pharynx Neck: Supple, no JVD.  No masses, thyromegaly or abnormal cervical nodes Lungs: Normal breath sounds bilaterally without wheezing.  Normal percussion Cardiac: regular rate and rhythm with normal S1 and S2, no S3 or S4.  PMI is normal.  No pathological murmurs Abdomen: Normal bowel sounds, abdomen is soft and nontender without masses, organomegaly or hernias noted.  No hepatosplenomegaly MSK: Kyphoscoliosis , difficulty with gait with some poor balance requiring a walker  Vascular: Status post below the knee amputation on the right side, palpable pulsesposterior tibial pulse left leg  Extremities: 2+ peripheral pitting edema in the left leg  Neurologic: Alert and oriented x 3 Skin: Intact without lesions or rashes Lymphatics: No significant adenopathy Psychologic: Normal affect  ECG: Not available  ASSESSMENT AND PLAN

## 2010-10-08 NOTE — Patient Instructions (Addendum)
Your physician recommends that you go to the [facility] for lab work in one month for BMET If the results of your test are normal or stable, you will receive a letter.  If they are abnormal, the nurse will contact you by phone. Follow up in 3 months - see above

## 2010-10-08 NOTE — Assessment & Plan Note (Signed)
No recurrent chest pain. LV function is stable at 50-55%

## 2010-10-11 ENCOUNTER — Inpatient Hospital Stay (HOSPITAL_COMMUNITY)
Admission: AD | Admit: 2010-10-11 | Discharge: 2010-10-16 | DRG: 470 | Disposition: A | Discharge status: Home / Self Care | Payer: Medicare Other | Source: Other Acute Inpatient Hospital | Attending: Internal Medicine | Admitting: Internal Medicine

## 2010-10-11 DIAGNOSIS — I739 Peripheral vascular disease, unspecified: Secondary | ICD-10-CM | POA: Diagnosis present

## 2010-10-11 DIAGNOSIS — I2589 Other forms of chronic ischemic heart disease: Secondary | ICD-10-CM | POA: Diagnosis present

## 2010-10-11 DIAGNOSIS — S88119A Complete traumatic amputation at level between knee and ankle, unspecified lower leg, initial encounter: Secondary | ICD-10-CM

## 2010-10-11 DIAGNOSIS — I129 Hypertensive chronic kidney disease with stage 1 through stage 4 chronic kidney disease, or unspecified chronic kidney disease: Secondary | ICD-10-CM | POA: Diagnosis present

## 2010-10-11 DIAGNOSIS — S72009A Fracture of unspecified part of neck of unspecified femur, initial encounter for closed fracture: Principal | ICD-10-CM | POA: Diagnosis present

## 2010-10-11 DIAGNOSIS — D649 Anemia, unspecified: Secondary | ICD-10-CM | POA: Diagnosis present

## 2010-10-11 DIAGNOSIS — I252 Old myocardial infarction: Secondary | ICD-10-CM

## 2010-10-11 DIAGNOSIS — Y998 Other external cause status: Secondary | ICD-10-CM

## 2010-10-11 DIAGNOSIS — I4891 Unspecified atrial fibrillation: Secondary | ICD-10-CM | POA: Diagnosis present

## 2010-10-11 DIAGNOSIS — E876 Hypokalemia: Secondary | ICD-10-CM | POA: Diagnosis present

## 2010-10-11 DIAGNOSIS — N183 Chronic kidney disease, stage 3 unspecified: Secondary | ICD-10-CM | POA: Diagnosis present

## 2010-10-11 DIAGNOSIS — Z7982 Long term (current) use of aspirin: Secondary | ICD-10-CM

## 2010-10-11 DIAGNOSIS — I509 Heart failure, unspecified: Secondary | ICD-10-CM | POA: Diagnosis present

## 2010-10-11 DIAGNOSIS — I5032 Chronic diastolic (congestive) heart failure: Secondary | ICD-10-CM | POA: Diagnosis present

## 2010-10-11 DIAGNOSIS — D72829 Elevated white blood cell count, unspecified: Secondary | ICD-10-CM | POA: Diagnosis present

## 2010-10-11 DIAGNOSIS — R296 Repeated falls: Secondary | ICD-10-CM | POA: Diagnosis present

## 2010-10-11 LAB — GLUCOSE, CAPILLARY: Glucose-Capillary: 131 mg/dL — ABNORMAL HIGH (ref 70–99)

## 2010-10-12 ENCOUNTER — Inpatient Hospital Stay (HOSPITAL_COMMUNITY): Payer: Medicare Other

## 2010-10-12 DIAGNOSIS — I517 Cardiomegaly: Secondary | ICD-10-CM

## 2010-10-12 LAB — CARDIAC PANEL(CRET KIN+CKTOT+MB+TROPI)
CK, MB: 2 ng/mL (ref 0.3–4.0)
CK, MB: 1.9 ng/mL (ref 0.3–4.0)
Relative Index: INVALID (ref 0.0–2.5)
Relative Index: INVALID (ref 0.0–2.5)
Total CK: 57 U/L (ref 7–232)
Troponin I: <0.3 ng/mL (ref <0.30)

## 2010-10-12 LAB — URINALYSIS, ROUTINE W REFLEX MICROSCOPIC
Bilirubin Urine: NEGATIVE
Glucose, UA: NEGATIVE mg/dL
Hgb urine dipstick: NEGATIVE
Ketones, ur: NEGATIVE mg/dL
Leukocytes, UA: NEGATIVE
Nitrite: NEGATIVE
Protein, ur: 30 mg/dL — AB
Specific Gravity, Urine: 1.015 (ref 1.005–1.030)
Urobilinogen, UA: 1 mg/dL (ref 0.0–1.0)
pH: 5 (ref 5.0–8.0)

## 2010-10-12 LAB — CBC
HCT: 26.1 % — ABNORMAL LOW (ref 39.0–52.0)
Platelets: 238 10*3/uL (ref 150–400)
RDW: 14.7 % (ref 11.5–15.5)
WBC: 10.2 10*3/uL (ref 4.0–10.5)

## 2010-10-12 LAB — BASIC METABOLIC PANEL
BUN: 41 mg/dL — ABNORMAL HIGH (ref 6–23)
Chloride: 99 mEq/L (ref 96–112)
GFR calc Af Amer: 43 mL/min — ABNORMAL LOW (ref >60)
Potassium: 3.7 mEq/L (ref 3.5–5.1)

## 2010-10-12 LAB — GLUCOSE, CAPILLARY
Glucose-Capillary: 165 mg/dL — ABNORMAL HIGH (ref 70–99)
Glucose-Capillary: 168 mg/dL — ABNORMAL HIGH (ref 70–99)

## 2010-10-12 LAB — PRO B NATRIURETIC PEPTIDE: Pro B Natriuretic peptide (BNP): 6620 pg/mL — ABNORMAL HIGH (ref 0–450)

## 2010-10-13 LAB — COMPREHENSIVE METABOLIC PANEL
Albumin: 2.3 g/dL — ABNORMAL LOW (ref 3.5–5.2)
Alkaline Phosphatase: 126 U/L — ABNORMAL HIGH (ref 39–117)
BUN: 49 mg/dL — ABNORMAL HIGH (ref 6–23)
Calcium: 8.7 mg/dL (ref 8.4–10.5)
Creatinine, Ser: 1.94 mg/dL — ABNORMAL HIGH (ref 0.50–1.35)
GFR calc Af Amer: 41 mL/min — ABNORMAL LOW (ref >60)
Glucose, Bld: 118 mg/dL — ABNORMAL HIGH (ref 70–99)
Total Protein: 7.2 g/dL (ref 6.0–8.3)

## 2010-10-13 LAB — GLUCOSE, CAPILLARY
Glucose-Capillary: 112 mg/dL — ABNORMAL HIGH (ref 70–99)
Glucose-Capillary: 139 mg/dL — ABNORMAL HIGH (ref 70–99)
Glucose-Capillary: 172 mg/dL — ABNORMAL HIGH (ref 70–99)
Glucose-Capillary: 138 mg/dL — ABNORMAL HIGH (ref 70–99)
Glucose-Capillary: 125 mg/dL — ABNORMAL HIGH (ref 70–99)

## 2010-10-13 LAB — CBC
HCT: 27.6 % — ABNORMAL LOW (ref 39.0–52.0)
Hemoglobin: 8.4 g/dL — ABNORMAL LOW (ref 13.0–17.0)
MCH: 24.9 pg — ABNORMAL LOW (ref 26.0–34.0)
MCHC: 30.4 g/dL (ref 30.0–36.0)
RDW: 14.9 % (ref 11.5–15.5)

## 2010-10-14 ENCOUNTER — Inpatient Hospital Stay (HOSPITAL_COMMUNITY): Payer: Medicare Other

## 2010-10-14 LAB — URINE CULTURE
Colony Count: 45000
Culture  Setup Time: 201209300140

## 2010-10-14 LAB — GLUCOSE, CAPILLARY
Glucose-Capillary: 123 mg/dL — ABNORMAL HIGH (ref 70–99)
Glucose-Capillary: 133 mg/dL — ABNORMAL HIGH (ref 70–99)
Glucose-Capillary: 160 mg/dL — ABNORMAL HIGH (ref 70–99)

## 2010-10-15 LAB — BASIC METABOLIC PANEL
BUN: 43 mg/dL — ABNORMAL HIGH (ref 6–23)
Creatinine, Ser: 1.77 mg/dL — ABNORMAL HIGH (ref 0.50–1.35)
GFR calc Af Amer: 40 mL/min — ABNORMAL LOW (ref >90)
GFR calc non Af Amer: 35 mL/min — ABNORMAL LOW (ref >90)
Potassium: 3.9 mEq/L (ref 3.5–5.1)

## 2010-10-15 LAB — CBC
Hemoglobin: 9.6 g/dL — ABNORMAL LOW (ref 13.0–17.0)
MCHC: 32 g/dL (ref 30.0–36.0)
RDW: 14.7 % (ref 11.5–15.5)
WBC: 10 10*3/uL (ref 4.0–10.5)

## 2010-10-15 LAB — GLUCOSE, CAPILLARY
Glucose-Capillary: 143 mg/dL — ABNORMAL HIGH (ref 70–99)
Glucose-Capillary: 149 mg/dL — ABNORMAL HIGH (ref 70–99)

## 2010-10-15 LAB — TYPE AND SCREEN
ABO/RH(D): O POS
Antibody Screen: NEGATIVE
Unit division: 0
Unit division: 0

## 2010-10-16 LAB — GLUCOSE, CAPILLARY
Glucose-Capillary: 165 mg/dL — ABNORMAL HIGH (ref 70–99)
Glucose-Capillary: 135 mg/dL — ABNORMAL HIGH (ref 70–99)
Glucose-Capillary: 175 mg/dL — ABNORMAL HIGH (ref 70–99)

## 2010-10-16 LAB — BASIC METABOLIC PANEL
Calcium: 8.7 mg/dL (ref 8.4–10.5)
GFR calc Af Amer: 35 mL/min — ABNORMAL LOW (ref >90)
GFR calc non Af Amer: 30 mL/min — ABNORMAL LOW (ref >90)
Sodium: 136 mEq/L (ref 135–145)

## 2010-10-16 LAB — CBC
MCH: 26.1 pg (ref 26.0–34.0)
MCHC: 32.1 g/dL (ref 30.0–36.0)
Platelets: 280 10*3/uL (ref 150–400)

## 2010-10-18 NOTE — Op Note (Signed)
NAMEMarland Kitchen  CHLOE, BAIG NO.:  [id number]  MEDICAL RECORD NO.:  [id number]  LOCATION:  4731                         FACILITY:  [facility]  PHYSICIAN:  Almedia Balls. Ranell Patrick, M.D. DATE OF BIRTH:  04-30-1929  DATE OF PROCEDURE:  10/14/2010 DATE OF DISCHARGE:                              OPERATIVE REPORT   PREOPERATIVE DIAGNOSIS:  Right displaced femoral neck fracture.  POSTOPERATIVE DIAGNOSIS:  Right displaced femoral neck fracture.  PROCEDURE PERFORMED:  Right hip hemiarthroplasty using DePuy trial lock prosthesis.  SURGEON:  Almedia Balls. Ranell Patrick, MD  ASSISTANT:  Donnie Coffin. Dixon, PA-C  ANESTHESIA:  General anesthesia was used.  ESTIMATED BLOOD LOSS:  400 mL.  FLUID REPLACEMENT:  1500 mL crystalloid.  FLUID REPLACEMENT:  Also, 1 unit packed red cells.  URINE OUTPUT:  200 mL.  INSTRUMENT COUNT:  Correct.  COMPLICATIONS:  None.  Preoperative antibiotics were given.  INDICATIONS:  The patient is a 77 year old male who suffered a ground- level fall injuring both shoulders in both lower extremities.  He had a right displaced femoral neck fracture and a left knee injury that maybe an occult femur fracture right at the knee joint and shoulders are nonoperative.  The hip is operative requiring operative.  He does have a BKA on that right side.  I discussed options thoroughly with the family. The patient was cleared medically and the patient desires surgical repair.  Informed consent obtained.  DESCRIPTION OF PROCEDURE:  After an adequate level of anesthesia was achieved, the patient was positioned gently in the left lateral decubitus position.  Left shoulder was positioned in a neutral position and padded appropriately.  All neurovascular structures were padded. The patient was secured in the operating table on the left lateral decubitus position with the right hip up.  We then sterilely prepped and draped the right hip in the usual manner.  We made a Aldean Jewett posterior approach starting at the vastus ridge and extending posteriorly.  Dissection down through subcutaneous tissues using Bovie. Tensor fascia lata divided in line with skin incision.  Short external rotators divided sharply off the posterior femur.  The hip capsule identified and opened and tagged.  We then went ahead and cut the neck one fingerbreadth above the lesser trochanter using the neck cut guide and oscillating saw.  We then went ahead and retrieved the head sized it to 54.  There was some mild away in the hip joint, but still some DC cartilage there.  We resected the ligament of teres next and went ahead and broached using the broach only system for the DePuy trial lock Press- Fit system up to a size 8.  We then trialed with 0 neck, 54 head ball had nice stable hip with good leg length and thus we get to trim off the knees and nice stability.  We then removed all the trial components, thoroughly irrigated, checked the hip socket for any loose debris, none was encountered.  We then went ahead and impacted the size 8 trial lock in with appropriate anteversion, impacting the Morse taper and then the head.  This was a 54 monopolar head with the 0 neck and then reduced the hip  into place again nice stability and with excellent stability.  We then went ahead and repaired the capsule and then also placed the piriformis back to the greater trochanter and then repaired the deep fascial layer with a #1 Vicryl suture followed by 0 Vicryl subcu deep 2- 0 Vicryl subcu and followed by 4-0 Monocryl for skin.  Steri-Strips were applied followed by a sterile dressing. The patient tolerated the surgery well.     Almedia Balls. Ranell Patrick, M.D.     SRN/MEDQ  D:  10/14/2010  T:  10/15/2010  Job:  [id number]  Electronically Signed by Malon Kindle  on 10/18/2010 03:11:40 AM

## 2010-10-28 NOTE — Discharge Summary (Signed)
NAME:  [name] Love, [name] Love NO.:  [id number]  MEDICAL RECORD NO.:  [id number]  LOCATION:  4731                         FACILITY:  [facility]  PHYSICIAN:  Clydia Llano, MD       DATE OF BIRTH:  Jun 16, 1929  DATE OF ADMISSION:  10/11/2010 DATE OF DISCHARGE:                        DISCHARGE SUMMARY - REFERRING   PRIMARY CARE PHYSICIAN:  Doreen Beam, MD in [facility] Internal Medicine.  REASON FOR ADMISSION:  Hip and humeral fracture.  DISCHARGE DIAGNOSES: 1. Right hip fracture status post right hemiarthroplasty. 2. Right humeral fracture treated conservatively. 3. Severe peripheral artery disease status post right below-the-knee     amputation. 4. Ischemic heart disease status post myocardial infarction. 5. Chronic atrial fibrillation. 6. Congestive heart failure. 7. Hypertension. 8. Hypercholesterolemia. 9. Nephrolithiasis. 10.Likely chronic kidney disease.  DISCHARGE MEDICATIONS: 1. Vicodin 5/325 one to two tablets every 4 hours as needed for pain. 2. MiraLax 17 g p.o. daily, hold for diarrhea. 3. Demerol 2 mg p.o. daily. 4. Amlodipine 5 mg p.o. daily. 5. Aspirin 81 mg p.o. daily. 6. Clonidine 0.2 mg p.o. b.i.d. 7. Fish oil 1 capsule p.o. b.i.d. 8. Furosemide 40 mg p.o. daily. 9. Metoprolol succinate 50 mg daily at bedtime. 10.Simvastatin 20 mg daily at bedtime. 11.Tylenol Extra Strength 500 mg p.o. b.i.d. 12.Xarelto 15 mg p.o. daily.  RADIOLOGY: 1. Portable hip x-ray on October 14, 2010, showed gross satisfactory     positioning and alignment status post right hip arthroplasty. 2. September 14, 2010, showed hip portable satisfactory position and     alignment of femoral and acetabular component. 3. Knee hemarthrosis and cortical irregularity, suspicion for     articular surface distal femoral fracture.  BRIEF HISTORY EXAMINATION:  [name] Love is a 77 year old male with peripheral vascular disease, ischemic heart disease, atrial fibrillation, diabetes mellitus type  2.  The patient came into the hospital because of right hip fracture.  The patient transferred from [facility] because of unavailability of orthopedic surgeon on call.  The patient's story started 2 days prior to admission after he fell and tripped between his stove and his TV.  The patient was able to get up and walk around actually for 2 days which he said he was in great deal of pain until he was seen by a visiting nurse who asked him to go to the hospital.  Initial evaluation in [facility] Emergency Department showed bilateral humeral fracture as well as right femoral fracture. The patient transferred to [facility] for further evaluation.  BRIEF HOSPITAL STAY: 1. Right femoral fracture.  The patient admitted to the hospital.     Orthopedic was consulted.  Because the patient was on Xarelto, the     patient was put off that for 48 hours and then was sent to the OR     for right hemiarthroplasty done by Dr. Ranell Patrick and the patient     tolerated that okay.  The patient was felt safe to be discharged to     skilled nursing facility for further physical therapy evaluation     over there. 2. Atrial fibrillation, chronic rate controlled with beta-blockers.     The patient on Xarelto as well as  aspirin.  This was being held     before surgery and on day of discharge, will be restarted again. 3. Peripheral vascular disease status post right BKA.  Continue     secondary prevention with controlling the cholesterol, high blood     pressure, and diabetes. 4. Diabetes mellitus type 2.  That seems controlled.  The patient on     small dose of glipizide that was continued throughout the hospital     stay. 5. Chronic kidney disease.  No recent records in the hospital here.     The patient came into the hospital, creatinine 1.85, at discharge     his creatinine of 2.0.  The patient on Lasix already.  This was     being continued throughout the hospital stay.  Please pay special      attention to his creatinine.  The patient probably needs BMET     within 1 week from the SNF admission.  DISCHARGE INSTRUCTIONS: 1. Activity:  As tolerated. 2. Disposition:  Skilled nursing facility. 3. Diet:  Heart healthy with carbohydrate-modified diet.  SPECIAL INSTRUCTIONS:  Need basic metabolic profile in 1 week.  Dry dressings to the right hip every day.     Clydia Llano, MD     ME/MEDQ  D:  10/16/2010  T:  10/16/2010  Job:  [id number]  cc:   Doreen Beam, MD  Electronically Signed by Clydia Llano  on 10/28/2010 01:50:03 PM

## 2010-11-10 NOTE — H&P (Signed)
NAME:  [name] Love, [name] Love NO.:  [id number]  MEDICAL RECORD NO.:  [id number]  LOCATION:  5030                         FACILITY:  [facility]  PHYSICIAN:  [name] Ruiz, MD    DATE OF BIRTH:  24-Dec-1929  DATE OF ADMISSION:  10/11/2010 DATE OF DISCHARGE:                             HISTORY & PHYSICAL   PRIMARY CARE PHYSICIAN:  [name] Beam, MD in [facility] Internal Medicine  CHIEF COMPLAINT:  Hip and humeral fractures.  HISTORY OF PRESENT ILLNESS:  [name] is a 77 year old man with a complex past medical history consisting of, 1. Severe peripheral arterial disease status post right BKA. 2. Ischemic heart disease status post MI. 3. Chronic atrial fibrillation. 4. CHF. 5. Hypertension,       presenting with a right hip fracture and bilateral     humeral fractures after a mechanical fall 2 days ago.  The patient     initially presented to [facility] earlier today and because     of the complexity of medical problems and the lack of an orthopedic     surgeon on-call for today, he was transferred to our institution     for further management.  The patient was doing well until about 2     days ago when he got up from his reclining chair and tried to pull     off his pants and fell in between a heater and another appliance.     The patient did not feel much of pain immediately; however, due to     inability to walk around, he was brought to the emergency     department at [facility] Emergency Department and [facility] 2     days later where he was found to have a right hip femur fracture as     well as bilateral humeral fractures.  The patient was medicated     with analgesics and subsequently transferred to our institution.     At present, the patient is asymptomatic.  PAST MEDICAL HISTORY:  Significant as stated above, 1. Hypertensive. 2. Hypercholesterolemia. 3. Peripheral arterial disease status post right BKA. 4. Ischemic heart disease status post MI  (unable to find     echocardiograms on e-chart). 5. CHF. 6. Atrial fibrillation on Xarelto. 7. Nephrolithiasis. 8. Allergic rhinitis. 9. GERD. 10.Diabetes.  PAST SURGICAL HISTORY: 1. Coronary artery bypass graft. 2. Cholecystectomy. 3. Hernia repair. 4. Lithotripsy. 5. Appendectomy. 6. Right BKA. 7. Last tetanus dose 2012.  CURRENT MEDICATIONS: 1. Tylenol Extra Strength 500 mg 1 tablet b.i.d. 2. Xarelto 15 mg once a day. 3. Aspirin 81 mg a day. 4. Amlodipine 5 mg a day. 5. Amaryl 2 mg a day. 6. Furosemide 40 mg a day. 7. Simvastatin 20 mg a day. 8. Fish oil one capsule twice a day. 9. Metoprolol XL 50 mg a day. 10.Clonidine 0.2 mg p.o. b.i.d.  ALLERGIES:  ACE INHIBITOR causes angioedema and hypotension; ANGIOTENSIN- RECEPTOR BLOCKERS causes hypotension, dry mouth, and swelling.  FAMILY HISTORY:  Noncontributory.  SOCIAL HISTORY:  The patient is a retired Education officer, environmental.  Lives with his wife.  Has two children.  No history of tobacco use or alcohol.  REVIEW  OF SYSTEMS:  CONSTITUTIONAL:  Denies fever, chills, or night sweats.  CARDIOVASCULAR:  The patient denies chest pain, shortness of breath or palpitations.  Denies orthopnea, nocturia, or PND.  Admits to edema of the lower extremities unless he elevates his leg daily. RESPIRATORY:  The patient was having a cough and was told he had a bronchitis, but now he is better and denies any cough or wheezes. GASTROINTESTINAL:  He suffered from constipation, but denies any recent diarrhea, hematochezia or melena.  Denies abdominal pain.  GU: Denies dysuria, frequency, or hematuria.  PHYSICAL EXAMINATION:  VITAL SIGNS:  Temperature 99.0, pulse 98, irregular, respirations 20, blood pressure 134/72, pulse oximetry 92% on room air. GENERAL APPEARANCE:  The patient is a well-built Caucasian man who appears in no acute distress.  He is alert and oriented.  He is in a very pleasant, good mood, and cooperative. NECK:  Supple without  distention of the jugular veins. HEART:  Irregularly regular. LUNGS:  Clear to auscultation anteriorly and on auscultation laterally. ABDOMEN:  Nondistended with normal bowel sounds, soft, nontender without organomegaly or masses palpable. EXTREMITIES:  Right BKA noted.  Left leg has trace pitting edema with hyperpigmentation and erythema and not palpable distal pulses. NEUROLOGIC:  He is alert and oriented x3.  Cranial nerves II through XII intact.  Motor strength, he is able to give me a good hand grip bilaterally, more so with a right than the left.  The left upper extremity is resting on a sling.  The left leg, he is able to move without any problems.  Finger-to-nose normal.  LABORATORY DATA:  Sodium 137, potassium 3.4, chloride 99, carbon dioxide 28, BUN 38, creatinine 1.88, glucose 169, calcium 8.9, bilirubin 0.7, AST 24, ALT 17, alk phos 135, total protein 8.6, albumin 3.2, PT 21.9, INR 1.8.  WBC is 15.1, hemoglobin 9.3, hematocrit 29.5, platelet count 276.  X-rays, fracture of the right humeral neck.  Fracture of the left humeral neck.  Subcapital fracture of the right proximal femur.  Chest x- ray is stable, moderate enlargement of the cardiac silhouette.  Post CABG.  No acute cardiopulmonary of pleural normal process is evident.  ASSESSMENT AND PLAN:  A 77 year old man with multiple chronic medical problems including severe peripheral arterial disease, ischemic heart disease status post MI, status post CABG, chronic atrial fibrillation, congestive heart failure, hypertension, hypercholesterolemia, and diabetes mellitus type 2, presenting with 1. Right hip fracture. 2. Bilateral humeral fracture. 3. Secondary diagnosis hypokalemia with potassium of 3.4. 4. Renal insufficiency with an estimated GFR of 35. 5. Hyperglycemia. 6. Elevated alkaline phosphatase. 7. Leukocytosis. 8. Anemia with an MCV of 82.3.  PLAN: 1. Admit the patient to Telemetry Ortho Floor. 2. Discontinue  aspirin and Xarelto. 3. Lovenox for DVT prophylaxis. 4. Increase metoprolol to 100 mg a day for better AFib control and in     anticipation for surgery. 5. Obtain b-type natriuretic peptide, EKG, fractionated alkaline     phosphatase, UA.  Strict intake and output.  Insulin sliding scale,     discontinue Amaryl.  Continue amlodipine, furosemide, simvastatin     and clonidine.  Replace potassium with oral K-Dur 40 mEq p.o. x1     dose.  Monitor electrolytes and H and H.  Obtain Hemoccult x3.     Consultation with PT and OT.  Consultation with Ortho.          ______________________________ [name] Ruiz, MD     GL/MEDQ  D:  10/11/2010  T:  10/11/2010  Job:  [id number]  Electronically Signed by [name] Ruiz MD on 11/10/2010 09:45:07 PM

## 2010-11-26 ENCOUNTER — Encounter: Payer: Self-pay | Admitting: *Deleted

## 2010-11-26 NOTE — Progress Notes (Signed)
  Creatinine significantly improved from 2 to 1.58. Labs stable.        Forward results to LMD also.                        ----- Message -----          From: Chesley Noon          Sent: 11/14/2010   8:36 AM            To: Peyton Bottoms, MD       Subject: Scan                                                        The image below was scanned by Chesley Noon [[id number]] on 11/14/2010 at 8:36 AM to the following: BASIC METABOLIC PANEL WITH GFR [[id number]] on 11/14/2010:    Patient notified of results by letter.

## 2011-01-02 ENCOUNTER — Ambulatory Visit (INDEPENDENT_AMBULATORY_CARE_PROVIDER_SITE_OTHER): Payer: Medicare Other | Admitting: Cardiology

## 2011-01-02 ENCOUNTER — Encounter: Payer: Self-pay | Admitting: Cardiology

## 2011-01-02 VITALS — BP 151/79 | HR 67 | Ht 73.0 in | Wt 205.0 lb

## 2011-01-02 DIAGNOSIS — I25810 Atherosclerosis of coronary artery bypass graft(s) without angina pectoris: Secondary | ICD-10-CM

## 2011-01-02 DIAGNOSIS — S88119A Complete traumatic amputation at level between knee and ankle, unspecified lower leg, initial encounter: Secondary | ICD-10-CM

## 2011-01-02 DIAGNOSIS — I4891 Unspecified atrial fibrillation: Secondary | ICD-10-CM

## 2011-01-02 DIAGNOSIS — E039 Hypothyroidism, unspecified: Secondary | ICD-10-CM

## 2011-01-02 DIAGNOSIS — Z89519 Acquired absence of unspecified leg below knee: Secondary | ICD-10-CM

## 2011-01-02 DIAGNOSIS — I4821 Permanent atrial fibrillation: Secondary | ICD-10-CM

## 2011-01-02 MED ORDER — GABAPENTIN 300 MG PO CAPS: 300.0000 mg | ORAL_CAPSULE | Freq: Two times a day (BID) | ORAL | Status: DC

## 2011-01-02 NOTE — Patient Instructions (Signed)
   Gabapentin 300mg  daily on first day, then twice a day thereafter Your physician recommends that you go to the [facility] for lab work for [employer] & TSH If the results of your test are normal or stable, you will receive a letter.  If they are abnormal, the nurse will contact you by phone. Your physician wants you to follow up in: 6 months.  You will receive a reminder letter in the mail one-two months in advance.  If you don't receive a letter, please call our office to schedule the follow up appointment

## 2011-01-07 DIAGNOSIS — E039 Hypothyroidism, unspecified: Secondary | ICD-10-CM | POA: Insufficient documentation

## 2011-01-07 NOTE — Assessment & Plan Note (Signed)
The patient has significant phantom leg pain.  I told him that I could write him a prescription for gabapentin with the first dose to be 300 mg and then continued at 300 mg by mouth twice a day.  This has been dose adjusted for his renal insufficiency.  Hopefully this will help him somewhat with his anxiety and insomnia also.  I told him to also discuss this further with his primary care physician to see if any other therapy is available in the event gabapentin does not appear to work.

## 2011-01-07 NOTE — Assessment & Plan Note (Signed)
Status post coronary bypass grafting.  Patient reports some sternotomy pain at the lower edge of the scar.  However there were no ischemic symptoms.  We will continue with the current medical therapy including risk factor modification with diabetes control and statin drug therapy.  He is also taking low-dose aspirin in conjunction with his Xarelto

## 2011-01-07 NOTE — Progress Notes (Signed)
[name] Bottoms, MD, [facility] Board Certified in Adult Cardiovascular Medicine,Internal Medicine and Critical Care Medicine    CC: followup after recent hospitalization for right hemiarthroplasty  HPI: The patient is a 77 year old male with a complex past medical history which includes vascular disease status post right BKA with a failed right femoral-popliteal bypass graft, ischemic heart disease with coronary bypass grafting 2009 and ejection fraction of 50-55%. The patient also has permanent atrial fibrillation which is rate controlled and for which she is taking Xarelto.  He reports no bleeding complications.  He still complains of significant phantom limb pain in the right leg.  He also has chronic renal insufficiency which appears to be stable with a GFR of 39 mL's per minute.  Xarelto has been dose adjusted to his renal insufficiency. The patient complains of some left arm pain but no chest pain.  This happened after he fell.  He has done well after his right hemiarthroplasty with no recurrent hip pain.  He feels he staying cold all the time but reports no other cardiovascular symptoms in particular no palpitations or chest pain or shortness of breath orthopnea PND.  He does report some insomnia.  He is also requesting if there is any medical therapy for his phantom extremity pain.     PMH: reviewed and listed in Problem List in Electronic Records (and see below) Past Medical History  Diagnosis Date  . Coronary artery disease     Status post coronary bypass grafting February 2009  . Arrhythmia     post op atrial fibrillation,treated with amiodarone  . Dyslipidemia   . Diabetes mellitus     type 2  . Hypertension   . Renal insufficiency     GFR 43 meals per minute April 2012  . Hypoalbuminemia   . Obesity     lungs pending essential hypertension  . Amputation of leg     Status post right BKA  . Peripheral vascular disease     Followed at [facility]  . Phantom limb pain     Past Surgical History  Procedure Date  . Coronary artery bypass graft     coronary artery bypass graft 02/2007 non st myocardial infarction 4 vessel  . Inguinal hernia repair   . Appendectomy   . Cholecystectomy   . Vascular surgery     PVD Dr.Greg Madilyn Fireman right femto pt bypass with gortex 02/04/2008 occluded by doppler 08/2008          Allergies/SH/FHX : available in Electronic Records for review  Allergies  Allergen Reactions  . Ace Inhibitors     REACTION: angioedema  . Angiotensin Receptor Blockers     REACTION: angioedema   History   Social History  . Marital Status: Married    Spouse Name: N/A    Number of Children: N/A  . Years of Education: N/A   Occupational History  . 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 Smoking status: Never Smoker   . Smokeless tobacco: Never Used  . Alcohol Use: No  . Drug Use: No  . Sexually Active: Not on file   Other Topics Concern  . Not on file   Social History Narrative  . No narrative on file   No family history on file.  Medications: Current Outpatient Prescriptions  Medication Sig Dispense Refill  . acetaminophen (TYLENOL) 500 MG tablet Take 500 mg by mouth every 6 (  six) hours as needed.        Marland Kitchen amLODipine (NORVASC) 5 MG tablet Take 1 tablet by mouth Daily.      Marland Kitchen aspirin 81 MG tablet Take 81 mg by mouth daily.        . Cholecalciferol (D3-1000) 1000 UNITS capsule Take 1,000 Units by mouth daily.        . cloNIDine (CATAPRES) 0.2 MG tablet Take 0.2 mg by mouth 2 (two) times daily.       . fish oil-omega-3 fatty acids 1000 MG capsule Take 1 g by mouth 2 (two) times daily.       . furosemide (LASIX) 40 MG tablet Take 40 mg by mouth daily.       Marland Kitchen glimepiride (AMARYL) 2 MG tablet Take 2 mg by mouth daily before breakfast.        . metoprolol (TOPROL-XL) 100 MG 24 hr tablet Take 50 mg by mouth daily.       . Rivaroxaban (XARELTO) 15 MG TABS tablet Take 1  tablet (15 mg total) by mouth daily.  30 tablet  6  . simvastatin (ZOCOR) 40 MG tablet Take 40 mg by mouth at bedtime.        . gabapentin (NEURONTIN) 300 MG capsule Take 1 capsule (300 mg total) by mouth 2 (two) times daily.  60 capsule  1    ROS: No nausea or vomiting. No fever or chills.No melena or hematochezia.No bleeding.No claudication.  Phantom limb pain as outlined above  Physical Exam: BP 151/79  Pulse 67  Ht 6\' 1"  (1.854 m)  Wt 205 lb (92.987 kg)  BMI 27.05 kg/m2 General:well-nourished white male sitting in a wheelchair Neck:normal carotid upstroke and no carotid bruits.  No thyromegaly nonnodular thyroid.JVP is 6 cm Lungs:Clear breath sounds bilaterally without any wheezing. Chest: Well-healed midsternotomy scar but with some sternal separation at the lower end of the sternum.  Otherwise stable Cardiac:irregular rate and rhythm with normal S1 and S2 with no definite pathological murmurs Vascular:no edema status post right BKA Skin:warm and dry Physcologic:normal affect  12lead ECG:not performed Limited bedside ECHO:N/A   Assessment and Plan

## 2011-01-07 NOTE — Assessment & Plan Note (Signed)
The patient complains of healing cold all the time.  We will check a TSH to make sure that is not hypothyroid.

## 2011-01-07 NOTE — Assessment & Plan Note (Signed)
Atrial fibrillation is rate controlled.  We will continue with the current medical therapy there are no plans for cardioversion.  The patient is at high risk for thromboembolic events and is appropriately anticoagulated with Xarelto dose adjusted for his renal insufficiency at 15 mg by mouth daily

## 2011-06-18 ENCOUNTER — Encounter: Payer: Self-pay | Admitting: Cardiology

## 2011-06-18 ENCOUNTER — Encounter: Payer: Self-pay | Admitting: Physician Assistant

## 2011-06-18 DIAGNOSIS — I5033 Acute on chronic diastolic (congestive) heart failure: Secondary | ICD-10-CM

## 2011-06-18 DIAGNOSIS — I509 Heart failure, unspecified: Secondary | ICD-10-CM

## 2011-06-19 DIAGNOSIS — R0602 Shortness of breath: Secondary | ICD-10-CM

## 2011-06-20 DIAGNOSIS — I5033 Acute on chronic diastolic (congestive) heart failure: Secondary | ICD-10-CM

## 2012-03-01 ENCOUNTER — Ambulatory Visit: Payer: Medicare Other | Admitting: Cardiology

## 2012-03-23 ENCOUNTER — Ambulatory Visit: Payer: Medicare Other | Admitting: Cardiology

## 2012-03-31 ENCOUNTER — Ambulatory Visit: Payer: Medicare Other | Admitting: Cardiology

## 2012-04-25 IMAGING — CR DG HIP 1V PORT*R*
1 series · 1 of 1 positions shown · non-contrast
Comparison: None.

CLINICAL DATA: Postop right hip arthroplasty

PORTABLE RIGHT HIP - 1 VIEW

[view not recorded]
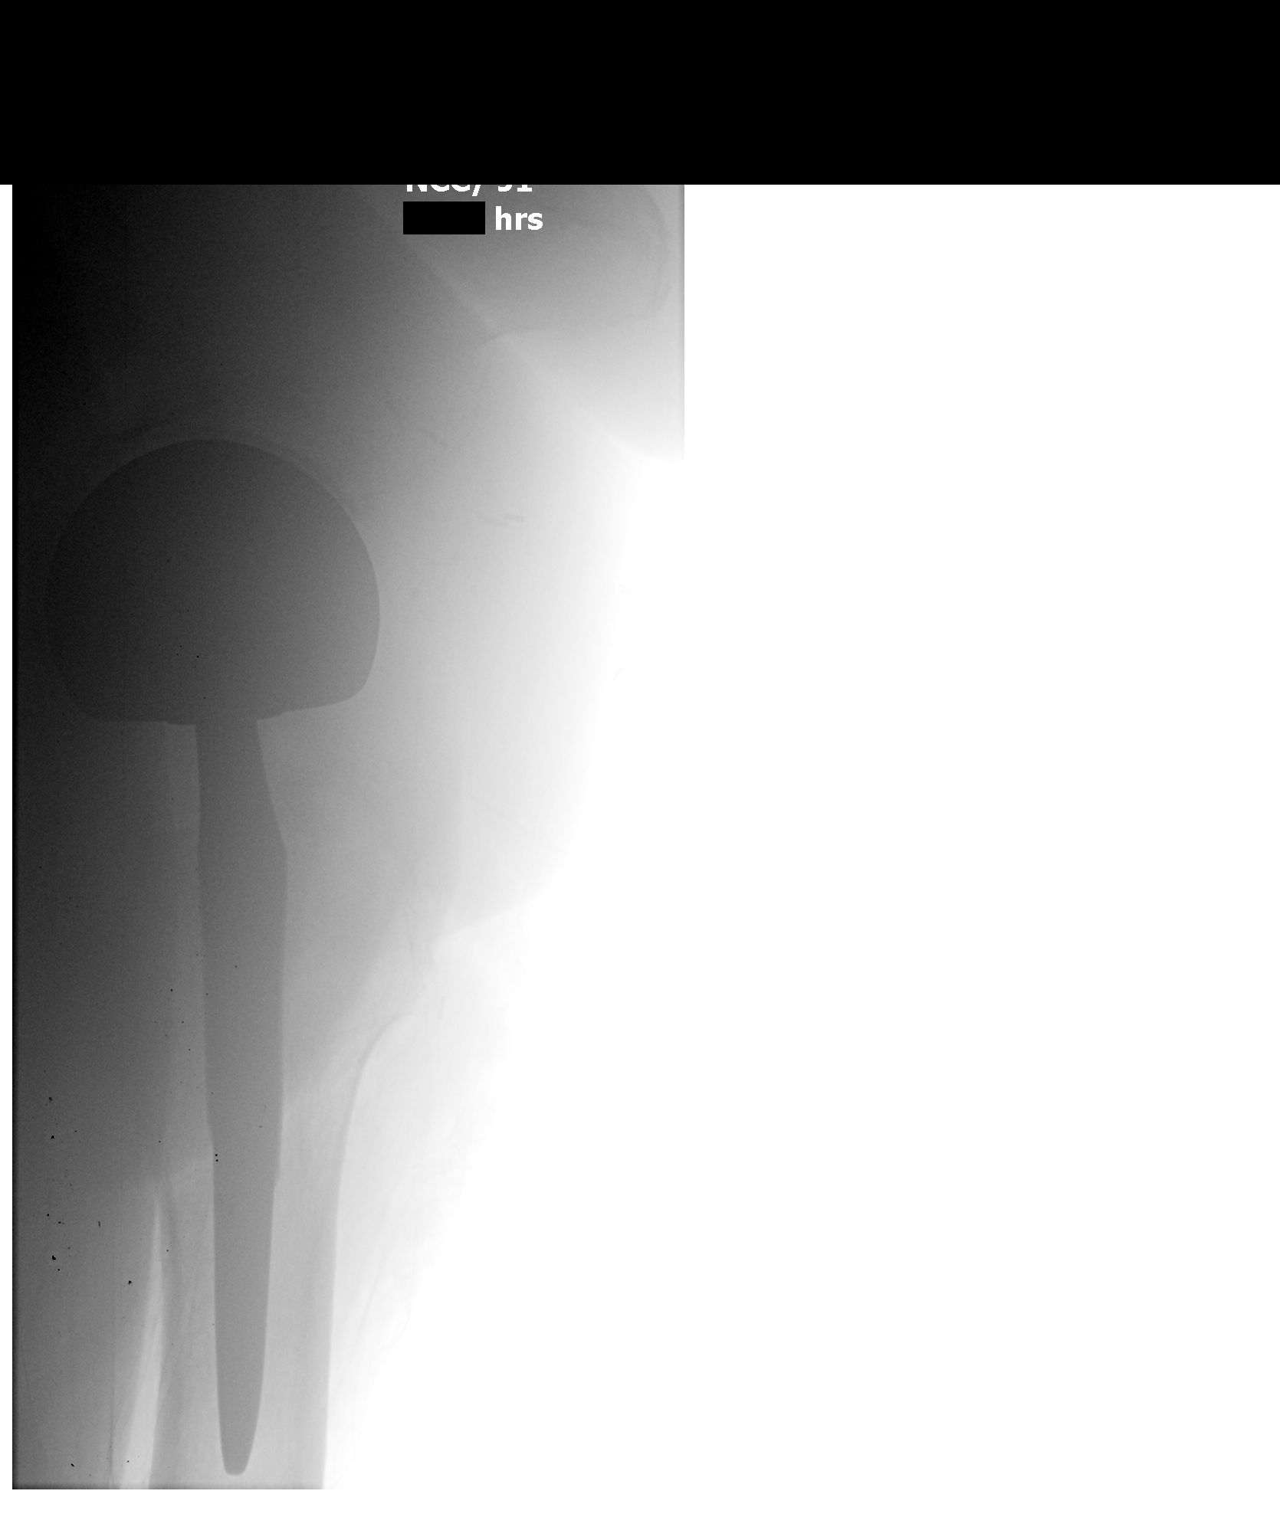

[1 of 1 positions shown; findings below may reference images not displayed]

FINDINGS: Cross-table portable view demonstrates grossly
satisfactory position and alignment status post right hip
arthroplasty.
IMPRESSION: As above.

## 2012-05-26 ENCOUNTER — Encounter: Payer: Self-pay | Admitting: Cardiology

## 2012-05-26 ENCOUNTER — Ambulatory Visit (INDEPENDENT_AMBULATORY_CARE_PROVIDER_SITE_OTHER): Payer: Medicare Other | Admitting: Cardiology

## 2012-05-26 VITALS — BP 160/66 | HR 69 | Ht 73.0 in | Wt 180.0 lb

## 2012-05-26 DIAGNOSIS — G547 Phantom limb syndrome without pain: Secondary | ICD-10-CM

## 2012-05-26 DIAGNOSIS — I10 Essential (primary) hypertension: Secondary | ICD-10-CM

## 2012-05-26 DIAGNOSIS — Z79899 Other long term (current) drug therapy: Secondary | ICD-10-CM

## 2012-05-26 DIAGNOSIS — N289 Disorder of kidney and ureter, unspecified: Secondary | ICD-10-CM

## 2012-05-26 DIAGNOSIS — I4891 Unspecified atrial fibrillation: Secondary | ICD-10-CM

## 2012-05-26 DIAGNOSIS — E785 Hyperlipidemia, unspecified: Secondary | ICD-10-CM

## 2012-05-26 DIAGNOSIS — G546 Phantom limb syndrome with pain: Secondary | ICD-10-CM

## 2012-05-26 DIAGNOSIS — Z951 Presence of aortocoronary bypass graft: Secondary | ICD-10-CM

## 2012-05-26 DIAGNOSIS — I25810 Atherosclerosis of coronary artery bypass graft(s) without angina pectoris: Secondary | ICD-10-CM

## 2012-05-26 DIAGNOSIS — I739 Peripheral vascular disease, unspecified: Secondary | ICD-10-CM

## 2012-05-26 DIAGNOSIS — I5030 Unspecified diastolic (congestive) heart failure: Secondary | ICD-10-CM

## 2012-05-26 DIAGNOSIS — I509 Heart failure, unspecified: Secondary | ICD-10-CM

## 2012-05-26 MED ORDER — ASPIRIN EC 81 MG PO TBEC: 81.0000 mg | DELAYED_RELEASE_TABLET | Freq: Every day | ORAL | Status: AC

## 2012-05-26 MED ORDER — METOPROLOL TARTRATE 50 MG PO TABS: 50.0000 mg | ORAL_TABLET | Freq: Two times a day (BID) | ORAL | Status: DC

## 2012-05-26 NOTE — Progress Notes (Signed)
Patient ID: [name] Love, male   DOB: [birth year], 79 y.o.   MRN: [id number] PCP: Dr. Sherril Croon  79 yo with history of CAD s/p CABG, PAD, permanent atrial fibrillation, and diastolic CHF presents for cardiology followup.  He had right BKA due to failed right fem-pop bypass and now walks with a walker on a prosthetic leg.  His main complaint is severe phantom limb pain that has been present since the amputation.  He has not found a medication that he can take for the pain that will not cause over-sedation.  He denies exertional dyspnea when walking with his walker.  He is not, however, particularly active.  No chest pain.  No orthopnea or PND.  He had severe GI bleeding requiring 3 units PRBCs in 6/13.  Xarelto was stopped.  A bleeding source was not identified.  He was told not to go back on anticoagulation.  BP is high today, but he states that SBP tends to run in the 130s at home.   Labs (6/13): creatinine 1.74  PMH: 1. PAD: s/p right BKA after failed right fem-pop bypass.   2. CAD: CABG in 2009 after NSTEMI.  3. Diastolic CHF: EF 28-41%.  4. Permanent atrial fibrillation: Stopped Xarelto after GI bleed in 6/13. 5. CKD 6. Hyperlipidemia 7. HTN 8. Type II diabetes 9. Obesity 10. H/o appendectomy 11. H/o cholecystectomy 12. Angioedema with ACEI and ARB  SH: Married, retired (operated gas station), nonsmoker, lives in [place].   FH: CAD  ROS: All systems reviewed and negative except as per HPI.   Current Outpatient Prescriptions  Medication Sig Dispense Refill  . acetaminophen (TYLENOL) 500 MG tablet Take 500 mg by mouth every 6 (six) hours as needed.        . Calcium Carb-Cholecalciferol (CALTRATE 600+D) 600-800 MG-UNIT TABS Take 1 tablet by mouth 2 (two) times daily.      . cloNIDine (CATAPRES) 0.2 MG tablet Take 0.1 mg by mouth 2 (two) times daily.       . ferrous sulfate 325 (65 FE) MG tablet Take 325 mg by mouth daily with breakfast.      . fish oil-omega-3 fatty acids 1000 MG capsule  Take 1 g by mouth daily.       . furosemide (LASIX) 40 MG tablet Take 40 mg by mouth daily.       Marland Kitchen glimepiride (AMARYL) 2 MG tablet Take 2 mg by mouth daily before breakfast.        . metoprolol (LOPRESSOR) 50 MG tablet Take 1 tablet (50 mg total) by mouth 2 (two) times daily.  60 tablet  6  . omeprazole (PRILOSEC) 20 MG capsule Take 1 capsule by mouth daily.      . potassium chloride (K-DUR) 10 MEQ tablet Take 1 tablet by mouth daily.      . simvastatin (ZOCOR) 40 MG tablet Take 40 mg by mouth at bedtime.        Marland Kitchen aspirin EC 81 MG tablet Take 1 tablet (81 mg total) by mouth daily.      Marland Kitchen gabapentin (NEURONTIN) 300 MG capsule Take 1 capsule (300 mg total) by mouth 2 (two) times daily.  60 capsule  1   No current facility-administered medications for this visit.    BP 160/66  Pulse 69  Ht 6\' 1"  (1.854 m)  Wt 180 lb (81.647 kg)  BMI 23.75 kg/m2 General: NAD Neck: No JVD, no thyromegaly or thyroid nodule.  Lungs: Clear to auscultation bilaterally with normal respiratory  effort. CV: Nondisplaced PMI.  Heart irregular S1/S2, no S3/S4, no murmur.  1+ left ankle edema.  No carotid bruit.  Unable to feel PT pulse on left.  Abdomen: Soft, nontender, no hepatosplenomegaly, no distention.  Neurologic: Alert and oriented x 3.  Psych: Normal affect. Extremities: No clubbing or cyanosis. Status post right BKA.   Assessment/Plan: 1. Atrial fibrillation: Permanent.  He is not on anticoagulation since he had life-threatening GI bleeding in 6/13.  No definite bleeding source was identified at the time per his report.  I am going to leave him off anticoagulation given this history.  HR is reasonably controlled.  He should take metoprolol bid rather than qd.   2. CAD: History of CABG.  No recent chest pain or significant dyspnea.  He is not anticoagulated due to GI bleeding . He is also not on ASA.  I think that he should start ASA 81 mg daily.  Continue statin.   3. Hyperlipidemia: Check lipids today  . 4. HTN: BP high today but patient says that SBP runs in the 130s at home.  He will check daily and record for 2 wks, we will then call him to see what BP is running.    5. Chronic diastolic CHF: He seems near-euvolemic on current Lasix dose.  Will continue this.  I will get an echocardiogram as LV systolic function has not been assessed for a number of years.  6. PAD: Difficult to feel pulses in left foot.  I will get peripheral arterial dopplers to evaluate.  7. Phantom limb pain: At right BKA site.  This is his main complaint.  I will refer him for pain clinic evaluation.  8. CKD: Check BMET.   Marca Ancona 05/26/2012

## 2012-05-26 NOTE — Patient Instructions (Addendum)
   Referral to Dr. Laurian Brim for phantom leg pain management  Peripheral arterial doppler - lower extremity  Begin Aspirin 81mg  daily  Echo  Increase Metoprolol to 50mg  twice a day    Labs for lipids, BMET, BNP, CBC   Office will contact with results Follow up in  4 months

## 2012-05-27 ENCOUNTER — Encounter (INDEPENDENT_AMBULATORY_CARE_PROVIDER_SITE_OTHER): Payer: Medicare Other

## 2012-05-27 DIAGNOSIS — I739 Peripheral vascular disease, unspecified: Secondary | ICD-10-CM

## 2012-05-30 ENCOUNTER — Encounter: Payer: Self-pay | Admitting: Cardiovascular Disease

## 2012-06-02 ENCOUNTER — Other Ambulatory Visit: Payer: Self-pay

## 2012-06-02 ENCOUNTER — Other Ambulatory Visit (INDEPENDENT_AMBULATORY_CARE_PROVIDER_SITE_OTHER): Payer: Medicare Other

## 2012-06-02 DIAGNOSIS — I4891 Unspecified atrial fibrillation: Secondary | ICD-10-CM

## 2012-06-02 DIAGNOSIS — I5030 Unspecified diastolic (congestive) heart failure: Secondary | ICD-10-CM

## 2012-06-02 DIAGNOSIS — I509 Heart failure, unspecified: Secondary | ICD-10-CM

## 2012-06-10 ENCOUNTER — Encounter: Payer: Self-pay | Admitting: *Deleted

## 2012-06-11 ENCOUNTER — Telehealth: Payer: Self-pay | Admitting: *Deleted

## 2012-06-11 DIAGNOSIS — I739 Peripheral vascular disease, unspecified: Secondary | ICD-10-CM

## 2012-06-11 NOTE — Telephone Encounter (Signed)
Message copied by Lesle Chris on Fri Jun 11, 2012  5:31 PM ------      Message from: Laurey Morale      Created: Fri Jun 04, 2012 11:49 AM       Needs PV consult with Dr. Kirke Corin in [place].  Has significant PAD on left (right BKA). ------

## 2012-06-11 NOTE — Telephone Encounter (Signed)
Notes Recorded by Lesle Chris, LPN on 1/47/8295 at 5:30 PM [facility]) notified of below. Okay to go ahead and schedule appointment. Son asked that we try to get for 3rd week of June, early am, & on a Wednesday if possible. Will forward order to [facility]) for scheduling. ------  Notes Recorded by Lesle Chris, LPN on 07/03/3084 at 10:56 AM Wife notified, states son Nelma Rothman helps make all these decisions & would like for me to discuss with him. [phone]

## 2012-07-13 ENCOUNTER — Encounter: Payer: Self-pay | Admitting: Cardiovascular Disease

## 2012-07-13 ENCOUNTER — Ambulatory Visit (INDEPENDENT_AMBULATORY_CARE_PROVIDER_SITE_OTHER): Payer: Medicare Other | Admitting: Cardiovascular Disease

## 2012-07-13 VITALS — BP 132/72 | HR 46 | Ht 73.0 in | Wt 202.0 lb

## 2012-07-13 DIAGNOSIS — I739 Peripheral vascular disease, unspecified: Secondary | ICD-10-CM

## 2012-07-13 HISTORY — PX: CATARACT EXTRACTION W/ INTRAOCULAR LENS  IMPLANT, BILATERAL: SHX1307

## 2012-07-13 MED ORDER — CLOPIDOGREL BISULFATE 75 MG PO TABS: 75.0000 mg | ORAL_TABLET | Freq: Every day | ORAL | Status: DC

## 2012-07-13 MED ORDER — PANTOPRAZOLE SODIUM 40 MG PO TBEC: 40.0000 mg | DELAYED_RELEASE_TABLET | Freq: Every day | ORAL | Status: AC

## 2012-07-13 NOTE — Progress Notes (Signed)
HPI  This is an 79 yo male with history of CAD s/p CABG, PAD, permanent atrial fibrillation, and diastolic CHF who was referred by Dr. Shirlee Latch for evaluation and management of peripheral arterial disease.  He had right BKA in 2010 due to failed right fem-pop bypass and now walks with a walker on a prosthetic leg. His main complaint is severe phantom limb pain that has been present since the amputation. He denies exertional dyspnea when walking with his walker. He is not, however, particularly active. No chest pain. No orthopnea or PND. He had severe GI bleeding requiring 3 units PRBCs in 6/13. Xarelto was stopped. A bleeding source was not identified. His son reports that the patient tolerated Plavix in the past without bleeding complications. He has been tolerating aspirin 81 mg once daily. He was noted to have poor distal pulses in the left lower extremity . Thus, an ABI was performed which was 0.45 on the left with evidence of multilevel disease. The patient denied discomfort in the left lower extremity. Obviously he walks but very slowly. He did have one wound recently on the anterior part of the left shin after he accidentally cut himself with a right leg prosthesis. This is a longer time to improve but currently seems to be completely healed.  Allergies  Allergen Reactions  . Ace Inhibitors     REACTION: angioedema  . Angiotensin Receptor Blockers     REACTION: angioedema     Current Outpatient Prescriptions on File Prior to Visit  Medication Sig Dispense Refill  . acetaminophen (TYLENOL) 500 MG tablet Take 500 mg by mouth every 6 (six) hours as needed.        Marland Kitchen aspirin EC 81 MG tablet Take 1 tablet (81 mg total) by mouth daily.      . Calcium Carb-Cholecalciferol (CALTRATE 600+D) 600-800 MG-UNIT TABS Take 1 tablet by mouth 2 (two) times daily.      . cloNIDine (CATAPRES) 0.2 MG tablet Take 0.1 mg by mouth 2 (two) times daily.       . ferrous sulfate 325 (65 FE) MG tablet Take 325 mg  by mouth daily with breakfast.      . fish oil-omega-3 fatty acids 1000 MG capsule Take 1 g by mouth daily.       . furosemide (LASIX) 40 MG tablet Take 40 mg by mouth daily.       Marland Kitchen glimepiride (AMARYL) 2 MG tablet Take 2 mg by mouth daily before breakfast.        . metoprolol (LOPRESSOR) 50 MG tablet Take 1 tablet (50 mg total) by mouth 2 (two) times daily.  60 tablet  6  . potassium chloride (K-DUR) 10 MEQ tablet Take 1 tablet by mouth daily.      . simvastatin (ZOCOR) 40 MG tablet Take 40 mg by mouth at bedtime.         No current facility-administered medications on file prior to visit.     Past Medical History  Diagnosis Date  . Coronary artery disease     Status post coronary bypass grafting February 2009  . Arrhythmia     post op atrial fibrillation,treated with amiodarone  . Dyslipidemia   . Diabetes mellitus     type 2  . Hypertension   . Renal insufficiency     GFR 43 meals per minute April 2012  . Hypoalbuminemia   . Obesity     lungs pending essential hypertension  . Amputation of leg  Status post right BKA  . Peripheral vascular disease     Followed [facility]  . Phantom limb pain      Past Surgical History  Procedure Laterality Date  . Coronary artery bypass graft      coronary artery bypass graft 02/2007 non st myocardial infarction 4 vessel  . Inguinal hernia repair    . Appendectomy    . Cholecystectomy    . Vascular surgery      PVD Dr.Greg Madilyn Fireman right femto pt bypass with gortex 02/04/2008 occluded by doppler 08/2008     No family history on file.   History   Social History  . Marital Status: Married    Spouse Name: N/A    Number of Children: N/A  . Years of Education: N/A   Occupational History  . 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 Smoking status: Never Smoker   . Smokeless tobacco: Never Used  . Alcohol Use: No  . Drug Use: No  . Sexually Active: Not on file    Other Topics Concern  . Not on file   Social History Narrative  . No narrative on file     ROS Standpoint review of system was performed greatest negative other than what is mentioned in the history of present illness.  PHYSICAL EXAM   BP 132/72  Pulse 46  Ht 6\' 1"  (1.854 m)  Wt 202 lb (91.627 kg)  BMI 26.66 kg/m2  SpO2 99%  Constitutional: He is oriented to person, place, and time. He appears frail but well-nourished. No distress.  HENT: No nasal discharge.  Head: Normocephalic and atraumatic.  Eyes: Pupils are equal and round.  Neck: Normal range of motion. Neck supple. No JVD present. No thyromegaly present.  Cardiovascular: Normal rate, irregular rhythm, normal heart sounds and. Exam reveals no gallop and no friction rub. Pulmonary/Chest: Effort normal and breath sounds normal. No stridor. No respiratory distress. He has no wheezes. He has no rales. He exhibits no tenderness.  Abdominal: Soft. Bowel sounds are normal. He exhibits no distension. There is no tenderness. There is no rebound and no guarding.  Musculoskeletal: Normal range of motion. He exhibits +1 edema and no tenderness.  Neurological: He is alert and oriented to person, place, and time. Coordination normal.  Skin: Skin is warm and dry. Chronic stasis dermatitis. He is not diaphoretic. No erythema. No pallor.  Psychiatric: He has a normal mood and affect. His behavior is normal. Judgment and thought content normal.  Vascular: Femoral pulses are mildly diminished bilaterally. Left popliteal pulses not palpable. Distal pulses are not palpable. There is dependent rubor.     ASSESSMENT AND PLAN

## 2012-07-13 NOTE — Assessment & Plan Note (Signed)
The patient is status post right BKA after a failed femorotibial bypass. He did not have evidence of multilevel disease on the left side which is concerning. There was evidence of inflow disease by waveform. Arterial duplex showed significant ostial left SFA stenosis as well as an occluded left popliteal artery. Although the patient currently does not complain of claudication, rest pain or ulceration ; I think he is at significant risk for progression to critical limb ischemia given the multilevel disease. I discussed with the patient and his family management options. He did have GI bleed last year while he was on anticoagulation. I think it makes sense to see if he can tolerate dual antiplatelet therapy before considering endovascular intervention on the left lower extremity. Thus, I will start him on Plavix 75 mg once daily. He also has chronic kidney disease with a creatinine of 1.8-1.9 which increases his risk for contrast-induced nephropathy. I will reevaluate the patient in 2 months from now. I instructed him and the family to notify me if he develops any lower extremity ulceration or significant discomfort.

## 2012-07-13 NOTE — Patient Instructions (Addendum)
Your physician has recommended you make the following change in your medication: START Plavix 75mg  take one by mouth daily, STOP Prilosec, START Protonix 40mg  take one by mouth daily  Your physician recommends that you schedule a follow-up appointment in: 2 MONTHS with Dr Kirke Corin

## 2012-09-07 ENCOUNTER — Encounter: Payer: Self-pay | Admitting: Cardiovascular Disease

## 2012-09-07 ENCOUNTER — Ambulatory Visit
Admission: RE | Admit: 2012-09-07 | Discharge: 2012-09-07 | Disposition: A | Discharge status: Home / Self Care | Payer: Medicare Other | Source: Ambulatory Visit | Attending: Cardiovascular Disease | Admitting: Cardiovascular Disease

## 2012-09-07 ENCOUNTER — Ambulatory Visit (INDEPENDENT_AMBULATORY_CARE_PROVIDER_SITE_OTHER): Payer: Medicare Other | Admitting: Cardiovascular Disease

## 2012-09-07 VITALS — BP 158/0 | HR 50 | Ht 73.0 in | Wt 206.0 lb

## 2012-09-07 DIAGNOSIS — I25810 Atherosclerosis of coronary artery bypass graft(s) without angina pectoris: Secondary | ICD-10-CM

## 2012-09-07 DIAGNOSIS — I10 Essential (primary) hypertension: Secondary | ICD-10-CM

## 2012-09-07 DIAGNOSIS — I4891 Unspecified atrial fibrillation: Secondary | ICD-10-CM

## 2012-09-07 DIAGNOSIS — I739 Peripheral vascular disease, unspecified: Secondary | ICD-10-CM

## 2012-09-07 DIAGNOSIS — I4821 Permanent atrial fibrillation: Secondary | ICD-10-CM

## 2012-09-07 LAB — CBC WITH DIFFERENTIAL/PLATELET
Eosinophils Relative: 4.2 % (ref 0.0–5.0)
HCT: 40.9 % (ref 39.0–52.0)
Hemoglobin: 13.6 g/dL (ref 13.0–17.0)
Lymphs Abs: 0.7 10*3/uL (ref 0.7–4.0)
Monocytes Relative: 8.3 % (ref 3.0–12.0)
Neutro Abs: 7 10*3/uL (ref 1.4–7.7)
RBC: 4.4 Mil/uL (ref 4.22–5.81)
WBC: 8.8 10*3/uL (ref 4.5–10.5)

## 2012-09-07 LAB — BASIC METABOLIC PANEL
GFR: 34.04 mL/min — ABNORMAL LOW (ref >60.00)
Potassium: 4.3 mEq/L (ref 3.5–5.1)
Sodium: 136 mEq/L (ref 135–145)

## 2012-09-07 LAB — PROTIME-INR: Prothrombin Time: 11.7 s (ref 10.2–12.4)

## 2012-09-07 NOTE — Assessment & Plan Note (Signed)
His ventricular rate is controlled. He is no longer on anticoagulation due to GI bleed. He is tolerating dual antiplatelet therapy with aspirin and Plavix.

## 2012-09-07 NOTE — Progress Notes (Signed)
  HPI  This is an 79 yo male with history of CAD s/p CABG, PAD, permanent atrial fibrillation, and diastolic CHF who is here today for a follow up visit regarding peripheral arterial disease. He had right BKA in 2010 due to failed right fem-pop bypass and now walks with a walker on a prosthetic leg. His main complaint is severe phantom limb pain that has been present since the amputation. He denies exertional dyspnea when walking with his walker. He is not, however, particularly active. No chest pain. No orthopnea or PND. He had severe GI bleeding requiring 3 units PRBCs in 6/13. Xarelto was stopped. A bleeding source was not identified.  He was noted to have poor distal pulses in the left lower extremity . Thus, an ABI was performed which was 0.45 on the left with evidence of multilevel disease. The patient denied discomfort in the left lower extremity. Obviously he walks but very slowly.  He did have 2 wounds recently on the anterior part of the left shin after he accidentally cut himself with a right leg prosthesis. The first time it happened that he is completely. He had another small wound which seems to be healing. He has some pain in the left foot at night when he is trying to go to sleep. There is significant dependent rubor. The left foot is cold.  Allergies  Allergen Reactions  . Ace Inhibitors     REACTION: angioedema  . Angiotensin Receptor Blockers     REACTION: angioedema     Current Outpatient Prescriptions on File Prior to Visit  Medication Sig Dispense Refill  . acetaminophen (TYLENOL) 500 MG tablet Take 500 mg by mouth every 6 (six) hours as needed.        . aspirin EC 81 MG tablet Take 1 tablet (81 mg total) by mouth daily.      . Calcium Carb-Cholecalciferol (CALTRATE 600+D) 600-800 MG-UNIT TABS Take 1 tablet by mouth 2 (two) times daily.      . cloNIDine (CATAPRES) 0.2 MG tablet Take 0.1 mg by mouth 2 (two) times daily.       . clopidogrel (PLAVIX) 75 MG tablet Take 1  tablet (75 mg total) by mouth daily.  30 tablet  11  . ferrous sulfate 325 (65 FE) MG tablet Take 325 mg by mouth daily with breakfast.      . fish oil-omega-3 fatty acids 1000 MG capsule Take 1 g by mouth daily.       . furosemide (LASIX) 40 MG tablet Take 40 mg by mouth daily.       . glimepiride (AMARYL) 2 MG tablet Take 2 mg by mouth daily before breakfast.        . metoprolol (LOPRESSOR) 50 MG tablet Take 1 tablet (50 mg total) by mouth 2 (two) times daily.  60 tablet  6  . pantoprazole (PROTONIX) 40 MG tablet Take 1 tablet (40 mg total) by mouth daily.  30 tablet  11  . potassium chloride (K-DUR) 10 MEQ tablet Take 1 tablet by mouth daily.      . simvastatin (ZOCOR) 40 MG tablet Take 40 mg by mouth at bedtime.         No current facility-administered medications on file prior to visit.     Past Medical History  Diagnosis Date  . Coronary artery disease     Status post coronary bypass grafting February 2009  . Arrhythmia     post op atrial fibrillation,treated with amiodarone  .   Dyslipidemia   . Diabetes mellitus     type 2  . Hypertension   . Renal insufficiency     GFR 43 meals per minute April 2012  . Hypoalbuminemia   . Obesity     lungs pending essential hypertension  . Amputation of leg     Status post right BKA  . Peripheral vascular disease     Followed at [facility]  . Phantom limb pain      Past Surgical History  Procedure Laterality Date  . Coronary artery bypass graft      coronary artery bypass graft 02/2007 non st myocardial infarction 4 vessel  . Inguinal hernia repair    . Appendectomy    . Cholecystectomy    . Vascular surgery      PVD Dr.Greg Hayes right femto pt bypass with gortex 02/04/2008 occluded by doppler 08/2008     No family history on file.   History   Social History  . Marital Status: Married    Spouse Name: N/A    Number of Children: N/A  . Years of Education: N/A   Occupational History  . 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 Smoking status: Never Smoker   . Smokeless tobacco: Never Used  . Alcohol Use: No  . Drug Use: No  . Sexual Activity: Not on file   Other Topics Concern  . Not on file   Social History Narrative  . No narrative on file     ROS Standpoint review of system was performed greatest negative other than what is mentioned in the history of present illness.  PHYSICAL EXAM   There were no vitals taken for this visit.  Constitutional: He is oriented to person, place, and time. He appears frail but well-nourished. No distress.  HENT: No nasal discharge.  Head: Normocephalic and atraumatic.  Eyes: Pupils are equal and round.  Neck: Normal range of motion. Neck supple. No JVD present. No thyromegaly present.  Cardiovascular: Normal rate, irregular rhythm, normal heart sounds and. Exam reveals no gallop and no friction rub. Pulmonary/Chest: Effort normal and breath sounds normal. No stridor. No respiratory distress. He has no wheezes. He has no rales. He exhibits no tenderness.  Abdominal: Soft. Bowel sounds are normal. He exhibits no distension. There is no tenderness. There is no rebound and no guarding.  Musculoskeletal: Normal range of motion. He exhibits +1 edema and no tenderness.  Neurological: He is alert and oriented to person, place, and time. Coordination normal.  Skin: Skin is warm and dry. Chronic stasis dermatitis. He is not diaphoretic. No erythema. No pallor.  Psychiatric: He has a normal mood and affect. His behavior is normal. Judgment and thought content normal.  Vascular: Femoral pulse: normal on the right side and diminished on the left side . left foot is cold.. Left popliteal pulses not palpable. Distal pulses are not palpable. There is dependent rubor.    EKG: Atrial fibrillation with slow ventricular rate.   ASSESSMENT AND PLAN   

## 2012-09-07 NOTE — Assessment & Plan Note (Signed)
The patient is status post right BKA after a failed femorotibial bypass. He does have evidence of multilevel disease on the left side which is concerning. There was evidence of inflow disease by waveform. Arterial duplex showed significant ostial left SFA stenosis as well as an occluded left popliteal artery. Currently he has no claudication likely due to poor functional capacity. However, I think he is at significant risk for progression to critical limb ischemia given the multilevel disease. The left foot is in the cold with significant dependent rubor.  I discussed with the patient and his family management options. He is tolerating treatment with aspirin and Plavix.  I recommend proceeding with abdominal aortogram, lower extremity runoff and possible angioplasty. This will be done with CO2 due to chronic kidney disease. I will plan on hydration the morning of the procedure consider keeping him overnight. The main goal of treatment would be to treat the inflow disease on the left side. I will plan access via the right femoral artery.

## 2012-09-07 NOTE — Patient Instructions (Addendum)
Abdominal Aortagram scheduled at [facility] 09/15/12 follow instructions given

## 2012-09-09 ENCOUNTER — Encounter (HOSPITAL_COMMUNITY): Payer: Self-pay | Admitting: Pharmacy Technician

## 2012-09-14 ENCOUNTER — Ambulatory Visit: Payer: Medicare Other | Admitting: Cardiovascular Disease

## 2012-09-15 ENCOUNTER — Encounter (HOSPITAL_COMMUNITY): Payer: Self-pay | Admitting: Physician Assistant

## 2012-09-15 ENCOUNTER — Observation Stay (HOSPITAL_COMMUNITY)
Admission: RE | Admit: 2012-09-15 | Discharge: 2012-09-17 | Disposition: A | Discharge status: Home / Self Care | Payer: Medicare Other | Source: Ambulatory Visit | Attending: Cardiovascular Disease | Admitting: Cardiovascular Disease

## 2012-09-15 ENCOUNTER — Encounter (HOSPITAL_COMMUNITY)
Admission: RE | Disposition: A | Discharge status: Home / Self Care | Payer: Self-pay | Source: Ambulatory Visit | Attending: Cardiovascular Disease

## 2012-09-15 DIAGNOSIS — I25810 Atherosclerosis of coronary artery bypass graft(s) without angina pectoris: Secondary | ICD-10-CM | POA: Diagnosis present

## 2012-09-15 DIAGNOSIS — E039 Hypothyroidism, unspecified: Secondary | ICD-10-CM | POA: Diagnosis present

## 2012-09-15 DIAGNOSIS — I70229 Atherosclerosis of native arteries of extremities with rest pain, unspecified extremity: Secondary | ICD-10-CM

## 2012-09-15 DIAGNOSIS — E785 Hyperlipidemia, unspecified: Secondary | ICD-10-CM | POA: Insufficient documentation

## 2012-09-15 DIAGNOSIS — I701 Atherosclerosis of renal artery: Secondary | ICD-10-CM | POA: Insufficient documentation

## 2012-09-15 DIAGNOSIS — I739 Peripheral vascular disease, unspecified: Secondary | ICD-10-CM

## 2012-09-15 DIAGNOSIS — N183 Chronic kidney disease, stage 3 unspecified: Secondary | ICD-10-CM | POA: Insufficient documentation

## 2012-09-15 DIAGNOSIS — I4821 Permanent atrial fibrillation: Secondary | ICD-10-CM | POA: Diagnosis present

## 2012-09-15 DIAGNOSIS — I129 Hypertensive chronic kidney disease with stage 1 through stage 4 chronic kidney disease, or unspecified chronic kidney disease: Secondary | ICD-10-CM | POA: Insufficient documentation

## 2012-09-15 DIAGNOSIS — I10 Essential (primary) hypertension: Secondary | ICD-10-CM

## 2012-09-15 DIAGNOSIS — Z89519 Acquired absence of unspecified leg below knee: Secondary | ICD-10-CM

## 2012-09-15 HISTORY — DX: Gastro-esophageal reflux disease without esophagitis: K21.9

## 2012-09-15 HISTORY — PX: LOWER EXTREMITY ANGIOGRAM: SHX5508

## 2012-09-15 HISTORY — PX: ABDOMINAL AORTAGRAM: SHX5454

## 2012-09-15 HISTORY — DX: Type 2 diabetes mellitus without complications: E11.9

## 2012-09-15 HISTORY — DX: Non-ST elevation (NSTEMI) myocardial infarction: I21.4

## 2012-09-15 HISTORY — PX: ABDOMINAL AORTAGRAM: SHX5706

## 2012-09-15 HISTORY — DX: Shortness of breath: R06.02

## 2012-09-15 HISTORY — DX: Unspecified atrial fibrillation: I48.91

## 2012-09-15 HISTORY — DX: Angina pectoris, unspecified: I20.9

## 2012-09-15 HISTORY — DX: Unspecified osteoarthritis, unspecified site: M19.90

## 2012-09-15 HISTORY — DX: Personal history of other medical treatment: Z92.89

## 2012-09-15 LAB — GLUCOSE, CAPILLARY
Glucose-Capillary: 93 mg/dL (ref 70–99)
Glucose-Capillary: 85 mg/dL (ref 70–99)
Glucose-Capillary: 202 mg/dL — ABNORMAL HIGH (ref 70–99)
Glucose-Capillary: 163 mg/dL — ABNORMAL HIGH (ref 70–99)

## 2012-09-15 LAB — BASIC METABOLIC PANEL
BUN: 46 mg/dL — ABNORMAL HIGH (ref 6–23)
Calcium: 8.9 mg/dL (ref 8.4–10.5)
Creatinine, Ser: 2.03 mg/dL — ABNORMAL HIGH (ref 0.50–1.35)
GFR calc Af Amer: 34 mL/min — ABNORMAL LOW (ref >90)
GFR calc non Af Amer: 29 mL/min — ABNORMAL LOW (ref >90)

## 2012-09-15 SURGERY — ABDOMINAL AORTAGRAM
Anesthesia: LOCAL

## 2012-09-15 MED ORDER — SODIUM CHLORIDE 0.9 % IV SOLN
INTRAVENOUS | Status: DC
  Administered 2012-09-15: 100 mL/h via INTRAVENOUS

## 2012-09-15 MED ORDER — HYDRALAZINE HCL 20 MG/ML IJ SOLN
10.0000 mg | INTRAMUSCULAR | Status: DC | PRN
  Administered 2012-09-15 – 2012-09-16 (×5): 10 mg via INTRAVENOUS
  Filled 2012-09-15 (×4): qty 1

## 2012-09-15 MED ORDER — SIMVASTATIN 40 MG PO TABS
40.0000 mg | ORAL_TABLET | Freq: Every day | ORAL | Status: DC
  Administered 2012-09-15 – 2012-09-16 (×2): 40 mg via ORAL
  Filled 2012-09-15 (×3): qty 1

## 2012-09-15 MED ORDER — ACETAMINOPHEN 325 MG PO TABS
650.0000 mg | ORAL_TABLET | Freq: Four times a day (QID) | ORAL | Status: DC | PRN
  Administered 2012-09-15 – 2012-09-17 (×6): 650 mg via ORAL
  Filled 2012-09-15 (×7): qty 2

## 2012-09-15 MED ORDER — ASPIRIN EC 81 MG PO TBEC
81.0000 mg | DELAYED_RELEASE_TABLET | Freq: Every day | ORAL | Status: DC
  Administered 2012-09-16 – 2012-09-17 (×2): 81 mg via ORAL
  Filled 2012-09-15 (×2): qty 1

## 2012-09-15 MED ORDER — CLONIDINE HCL 0.1 MG PO TABS
0.1000 mg | ORAL_TABLET | Freq: Two times a day (BID) | ORAL | Status: DC
  Administered 2012-09-15: 21:00:00 0.1 mg via ORAL
  Filled 2012-09-15 (×3): qty 1

## 2012-09-15 MED ORDER — VITAMIN C 500 MG PO TABS
500.0000 mg | ORAL_TABLET | Freq: Every day | ORAL | Status: DC
  Administered 2012-09-15 – 2012-09-17 (×3): 500 mg via ORAL
  Filled 2012-09-15 (×3): qty 1

## 2012-09-15 MED ORDER — POTASSIUM CHLORIDE ER 10 MEQ PO TBCR
10.0000 meq | EXTENDED_RELEASE_TABLET | Freq: Every day | ORAL | Status: DC
  Administered 2012-09-15 – 2012-09-17 (×3): 10 meq via ORAL
  Filled 2012-09-15 (×3): qty 1

## 2012-09-15 MED ORDER — LIDOCAINE HCL (PF) 1 % IJ SOLN
INTRAMUSCULAR | Status: AC
  Filled 2012-09-15: qty 30

## 2012-09-15 MED ORDER — SODIUM CHLORIDE 0.9 % IV SOLN: 250.0000 mL | INTRAVENOUS | Status: DC | PRN

## 2012-09-15 MED ORDER — MIDAZOLAM HCL 2 MG/2ML IJ SOLN
INTRAMUSCULAR | Status: AC
  Filled 2012-09-15: qty 2

## 2012-09-15 MED ORDER — SODIUM CHLORIDE 0.9 % IJ SOLN: 3.0000 mL | Freq: Two times a day (BID) | INTRAMUSCULAR | Status: DC

## 2012-09-15 MED ORDER — HYDRALAZINE HCL 20 MG/ML IJ SOLN
INTRAMUSCULAR | Status: AC
  Administered 2012-09-16: 10 mg via INTRAVENOUS
  Filled 2012-09-15: qty 1

## 2012-09-15 MED ORDER — METOPROLOL TARTRATE 50 MG PO TABS
50.0000 mg | ORAL_TABLET | Freq: Two times a day (BID) | ORAL | Status: DC
  Administered 2012-09-15 – 2012-09-16 (×2): 50 mg via ORAL
  Filled 2012-09-15 (×3): qty 1

## 2012-09-15 MED ORDER — PANTOPRAZOLE SODIUM 40 MG PO TBEC
40.0000 mg | DELAYED_RELEASE_TABLET | Freq: Every day | ORAL | Status: DC
  Administered 2012-09-15 – 2012-09-17 (×3): 40 mg via ORAL
  Filled 2012-09-15 (×3): qty 1

## 2012-09-15 MED ORDER — AMITRIPTYLINE HCL 25 MG PO TABS
25.0000 mg | ORAL_TABLET | Freq: Every day | ORAL | Status: DC
  Administered 2012-09-15 – 2012-09-16 (×2): 25 mg via ORAL
  Filled 2012-09-15 (×3): qty 1

## 2012-09-15 MED ORDER — FERROUS SULFATE 325 (65 FE) MG PO TABS
325.0000 mg | ORAL_TABLET | Freq: Every day | ORAL | Status: DC
  Administered 2012-09-16 – 2012-09-17 (×2): 325 mg via ORAL
  Filled 2012-09-15 (×3): qty 1

## 2012-09-15 MED ORDER — SODIUM CHLORIDE 0.9 % IV SOLN
INTRAVENOUS | Status: AC
  Administered 2012-09-15: 22:00:00 via INTRAVENOUS

## 2012-09-15 MED ORDER — SODIUM CHLORIDE 0.9 % IJ SOLN: 3.0000 mL | INTRAMUSCULAR | Status: DC | PRN

## 2012-09-15 MED ORDER — GLIMEPIRIDE 2 MG PO TABS
2.0000 mg | ORAL_TABLET | Freq: Every day | ORAL | Status: DC
  Administered 2012-09-16 – 2012-09-17 (×2): 2 mg via ORAL
  Filled 2012-09-15 (×3): qty 1

## 2012-09-15 MED ORDER — HEPARIN (PORCINE) IN NACL 2-0.9 UNIT/ML-% IJ SOLN
INTRAMUSCULAR | Status: AC
  Filled 2012-09-15: qty 1000

## 2012-09-15 NOTE — Progress Notes (Signed)
Dr. Kirke Corin paged, BUN and creatinine reported. No new orders received.

## 2012-09-15 NOTE — H&P (View-Only) (Signed)
HPI  This is an 79 yo male with history of CAD s/p CABG, PAD, permanent atrial fibrillation, and diastolic CHF who is here today for a follow up visit regarding peripheral arterial disease. He had right BKA in 2010 due to failed right fem-pop bypass and now walks with a walker on a prosthetic leg. His main complaint is severe phantom limb pain that has been present since the amputation. He denies exertional dyspnea when walking with his walker. He is not, however, particularly active. No chest pain. No orthopnea or PND. He had severe GI bleeding requiring 3 units PRBCs in 6/13. Xarelto was stopped. A bleeding source was not identified.  He was noted to have poor distal pulses in the left lower extremity . Thus, an ABI was performed which was 0.45 on the left with evidence of multilevel disease. The patient denied discomfort in the left lower extremity. Obviously he walks but very slowly.  He did have 2 wounds recently on the anterior part of the left shin after he accidentally cut himself with a right leg prosthesis. The first time it happened that he is completely. He had another small wound which seems to be healing. He has some pain in the left foot at night when he is trying to go to sleep. There is significant dependent rubor. The left foot is cold.  Allergies  Allergen Reactions  . Ace Inhibitors     REACTION: angioedema  . Angiotensin Receptor Blockers     REACTION: angioedema     Current Outpatient Prescriptions on File Prior to Visit  Medication Sig Dispense Refill  . acetaminophen (TYLENOL) 500 MG tablet Take 500 mg by mouth every 6 (six) hours as needed.        Marland Kitchen aspirin EC 81 MG tablet Take 1 tablet (81 mg total) by mouth daily.      . Calcium Carb-Cholecalciferol (CALTRATE 600+D) 600-800 MG-UNIT TABS Take 1 tablet by mouth 2 (two) times daily.      . cloNIDine (CATAPRES) 0.2 MG tablet Take 0.1 mg by mouth 2 (two) times daily.       . clopidogrel (PLAVIX) 75 MG tablet Take 1  tablet (75 mg total) by mouth daily.  30 tablet  11  . ferrous sulfate 325 (65 FE) MG tablet Take 325 mg by mouth daily with breakfast.      . fish oil-omega-3 fatty acids 1000 MG capsule Take 1 g by mouth daily.       . furosemide (LASIX) 40 MG tablet Take 40 mg by mouth daily.       Marland Kitchen glimepiride (AMARYL) 2 MG tablet Take 2 mg by mouth daily before breakfast.        . metoprolol (LOPRESSOR) 50 MG tablet Take 1 tablet (50 mg total) by mouth 2 (two) times daily.  60 tablet  6  . pantoprazole (PROTONIX) 40 MG tablet Take 1 tablet (40 mg total) by mouth daily.  30 tablet  11  . potassium chloride (K-DUR) 10 MEQ tablet Take 1 tablet by mouth daily.      . simvastatin (ZOCOR) 40 MG tablet Take 40 mg by mouth at bedtime.         No current facility-administered medications on file prior to visit.     Past Medical History  Diagnosis Date  . Coronary artery disease     Status post coronary bypass grafting February 2009  . Arrhythmia     post op atrial fibrillation,treated with amiodarone  .  Dyslipidemia   . Diabetes mellitus     type 2  . Hypertension   . Renal insufficiency     GFR 43 meals per minute April 2012  . Hypoalbuminemia   . Obesity     lungs pending essential hypertension  . Amputation of leg     Status post right BKA  . Peripheral vascular disease     Followed at [facility]  . Phantom limb pain      Past Surgical History  Procedure Laterality Date  . Coronary artery bypass graft      coronary artery bypass graft 02/2007 non st myocardial infarction 4 vessel  . Inguinal hernia repair    . Appendectomy    . Cholecystectomy    . Vascular surgery      PVD Dr.Greg Madilyn Fireman right femto pt bypass with gortex 02/04/2008 occluded by doppler 08/2008     No family history on file.   History   Social History  . Marital Status: Married    Spouse Name: N/A    Number of Children: N/A  . Years of Education: N/A   Occupational History  . 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 Smoking status: Never Smoker   . Smokeless tobacco: Never Used  . Alcohol Use: No  . Drug Use: No  . Sexual Activity: Not on file   Other Topics Concern  . Not on file   Social History Narrative  . No narrative on file     ROS Standpoint review of system was performed greatest negative other than what is mentioned in the history of present illness.  PHYSICAL EXAM   There were no vitals taken for this visit.  Constitutional: He is oriented to person, place, and time. He appears frail but well-nourished. No distress.  HENT: No nasal discharge.  Head: Normocephalic and atraumatic.  Eyes: Pupils are equal and round.  Neck: Normal range of motion. Neck supple. No JVD present. No thyromegaly present.  Cardiovascular: Normal rate, irregular rhythm, normal heart sounds and. Exam reveals no gallop and no friction rub. Pulmonary/Chest: Effort normal and breath sounds normal. No stridor. No respiratory distress. He has no wheezes. He has no rales. He exhibits no tenderness.  Abdominal: Soft. Bowel sounds are normal. He exhibits no distension. There is no tenderness. There is no rebound and no guarding.  Musculoskeletal: Normal range of motion. He exhibits +1 edema and no tenderness.  Neurological: He is alert and oriented to person, place, and time. Coordination normal.  Skin: Skin is warm and dry. Chronic stasis dermatitis. He is not diaphoretic. No erythema. No pallor.  Psychiatric: He has a normal mood and affect. His behavior is normal. Judgment and thought content normal.  Vascular: Femoral pulse: normal on the right side and diminished on the left side . left foot is cold.. Left popliteal pulses not palpable. Distal pulses are not palpable. There is dependent rubor.    EKG: Atrial fibrillation with slow ventricular rate.   ASSESSMENT AND PLAN

## 2012-09-15 NOTE — Interval H&P Note (Signed)
History and Physical Interval Note:  09/15/2012 12:58 PM  [name] Love  has presented today for surgery, with the diagnosis of pvd  The various methods of treatment have been discussed with the patient and family. After consideration of risks, benefits and other options for treatment, the patient has consented to  Procedure(s): ABDOMINAL AORTAGRAM (N/A) as a surgical intervention .  The patient's history has been reviewed, patient examined, no change in status, stable for surgery.  I have reviewed the patient's chart and labs.  Questions were answered to the patient's satisfaction.     Lorine Bears

## 2012-09-15 NOTE — Consult Note (Signed)
[facility] Consult  Reason for Consult:  Rest pain Referring Physician:  Kirke Corin  [id number]  History of Present Illness: This is a 79 y.o. male who was admitted today for an abdominal aortic angiogram with bilateral runoff with CO2 angiography for severe rest pain.  He was found to have a left renal artery stenosis, severe ostial left SFA stenosis and occluded left popliteal artery with reconstitution in the peroneal artery, which is the only patent vessel below the knee.  He did not have severe aortoiliac disease.  He does have a hx of a right femoral to posterior tibial bypass with gortex graft on 02/04/08 by Dr. Madilyn Fireman.  This subsequently occluded and he underwent a right BKA in 2010 and now walks on prosthetic leg with walker.  Pt states that his LLE has hurt him for ~ 5-6 years and has progressively worsened and he now has pain at rest.  He had an ABI of 0.45 per Dr. Jari Sportsman note, but I am not sure when this was obtained.  He does have an ulcer on his LLE anterior shin, which he hit with his prosthetic leg and appears to be healing.  He did have an ulcer prior to that just proximal to it that has healed.  On 03/10/07, he underwent CABG.  His right thigh great saphenous vein was has harvested, but became too small below the knee and therefore his left thigh greater saphenous vein was then harvested.  He also has a hx of Atrial fibrillation (on Plavix), diastolic CHF, DM, and renal insufficiency with a BUN/creatinine today of 46/2.03.  He did have a GIB in June 2013 requiring 3 PRBC's and at that time, his xarelto was discontinued.  He is on a statin for hypercholesterolemia as well as oral agents for his DM and oral antihypertensives for HTN.  Past Medical History  Diagnosis Date  . Coronary artery disease     Status post coronary bypass grafting February 2009  . Arrhythmia     post op atrial fibrillation,treated with amiodarone  . Dyslipidemia   . Diabetes mellitus      type 2  . Hypertension   . Renal insufficiency     GFR 43 meals per minute April 2012  . Hypoalbuminemia   . Obesity     lungs pending essential hypertension  . Amputation of leg     Status post right BKA  . Peripheral vascular disease     Followed at [facility]  . Phantom limb pain    Past Surgical History  Procedure Laterality Date  . Coronary artery bypass graft      coronary artery bypass graft 02/2007 non st myocardial infarction 4 vessel  . Inguinal hernia repair    . Appendectomy    . Cholecystectomy    . Vascular surgery      PVD Dr.Greg Madilyn Fireman right femto pt bypass with gortex 02/04/2008 occluded by doppler 08/2008    Allergies  Allergen Reactions  . Ace Inhibitors     REACTION: angioedema  . Angiotensin Receptor Blockers     REACTION: angioedema  . Cymbalta [Duloxetine Hcl] Other (See Comments)    Abnormal behavior  . Lisinopril Other (See Comments)    Abnormal behavior    Prior to Admission medications   Medication Sig Start Date End Date Taking? Authorizing Provider  acetaminophen (TYLENOL) 500 MG tablet Take 500 mg by mouth every 6 (six) hours as needed. For pain   Yes Historical Provider, MD  aspirin EC 81 MG tablet Take 1 tablet (81 mg total) by mouth daily. 05/26/12  Yes Laurey Morale, MD  Calcium Carbonate-Vitamin D (CALCIUM + D PO) Take 1 tablet by mouth 2 (two) times daily.   Yes Historical Provider, MD  cloNIDine (CATAPRES) 0.2 MG tablet Take 0.1 mg by mouth 2 (two) times daily.    Yes Historical Provider, MD  clopidogrel (PLAVIX) 75 MG tablet Take 75 mg by mouth every morning.   Yes Historical Provider, MD  ferrous sulfate 325 (65 FE) MG tablet Take 325 mg by mouth daily with breakfast.   Yes Historical Provider, MD  fish oil-omega-3 fatty acids 1000 MG capsule Take 1 g by mouth daily.    Yes Historical Provider, MD  furosemide (LASIX) 40 MG tablet Take 40 mg by mouth daily.    Yes Historical Provider, MD  glimepiride (AMARYL) 2 MG tablet Take  2 mg by mouth daily before breakfast.     Yes Historical Provider, MD  metoprolol (LOPRESSOR) 50 MG tablet Take 1 tablet (50 mg total) by mouth 2 (two) times daily. 05/26/12  Yes Laurey Morale, MD  pantoprazole (PROTONIX) 40 MG tablet Take 1 tablet (40 mg total) by mouth daily. 07/13/12  Yes Iran Ouch, MD  potassium chloride (K-DUR) 10 MEQ tablet Take 1 tablet by mouth daily. 05/10/12  Yes Historical Provider, MD  simvastatin (ZOCOR) 40 MG tablet Take 40 mg by mouth at bedtime.     Yes Historical Provider, MD  sodium chloride (OCEAN) 0.65 % nasal spray Place 1 spray into the nose as needed for congestion. For nasal congestion   Yes Historical Provider, MD  vitamin C (ASCORBIC ACID) 500 MG tablet Take 500 mg by mouth daily.   Yes Historical Provider, MD    Statin:  yes Beta Blocker:  yes metoprolol  Aspirin:  yes ACEI:  no angiodema ARB:  no angiodema Other antiplatelets/anticoagulants:  yes plaix  History   Social History  . Marital Status: Married    Spouse Name: N/A    Number of Children: N/A  . Years of Education: N/A   Occupational History  . 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 Smoking status: Never Smoker   . Smokeless tobacco: Never Used  . Alcohol Use: No  . Drug Use: No  . Sexual Activity: Not on file   Other Topics Concern  . Not on file   Social History Narrative  . No narrative on file   Family History Father deceased age 17 with history of "fluid" Mother deceased age 29 with no major illness  ROS: [x]  Positive   [ ]  Negative   [ ]  All sytems reviewed and are negative  Cardiovascular: []  chest pain/pressure []  palpitations [x]  Afib [x]  SOB  []  DOE []  pain in legs while walking [x]  phantom pain [x]  pain in legs at rest []  pain in legs at night []  non-healing ulcers []  hx of DVT []  swelling in legs   Pulmonary: []  productive cough []  asthma/wheezing []  home  O2  Neurologic: []  weakness in []  arms []  legs []  numbness in []  arms []  legs []  hx of CVA [x]  mini stroke with left foot drop ~ 6-7 years ago-resolved [] difficulty speaking or slurred speech []  temporary loss of vision in one eye []  dizziness  Hematologic: []  hx of cancer []  bleeding problems []  problems with blood clotting easily  Endocrine:   [  x] diabetes [x]  thyroid disease-on a pill for thyroid  GI []  vomiting blood []  blood in stool [x]  dark stool due to taking iron  GU: [x]  CKD/renal failure []  HD--[]  M/W/F or []  T/T/S []  burning with urination []  blood in urine  Psychiatric: []  anxiety []  depression  Musculoskeletal: [x]  arthritis []  joint pain  Integumentary: []  rashes [x]  ulcers-LLE anterior leg-present for a little over a week.  Hit it with his prosthetic leg.  Constitutional: []  fever []  chills   Physical Examination  Filed Vitals:   09/15/12 1300  BP:   Pulse: 46  Temp:   Resp:    Body mass index is 27.18 kg/(m^2).  General:  WDWN in NAD Gait: Not observed HENT: WNL, normocephalic Eyes: Pupils equal Pulmonary: normal non-labored breathing, without Rales, rhonchi,  wheezing Cardiac: irregular irregular, without  Murmurs, rubs or gallops; without carotid bruits Abdomen: soft, NT/ND, no masses Skin: without rashes, with ulcer to right lower extremity anterior shin ~ size of dime.  It is scabbed over. Vascular Exam/Pulses:non palpable right pedal pulses; right femoral pulse is palpable; pressure being held to left femoral artery. Extremities: without ischemic changes, without Gangrene , without cellulitis; without open wounds;  Well healed right BKA Musculoskeletal: no muscle wasting or atrophy  Neurologic: A&O X 3; Appropriate Affect ; SENSATION: normal; MOTOR FUNCTION:  moving all extremities equally. Speech is fluent/normal  CBC    Component Value Date/Time   WBC 8.8 09/07/2012 0958   RBC 4.40 09/07/2012 0958   HGB 13.6 09/07/2012 0958    HCT 40.9 09/07/2012 0958   PLT 290.0 09/07/2012 0958   MCV 92.9 09/07/2012 0958   MCH 26.1 10/16/2010 0552   MCHC 33.3 09/07/2012 0958   RDW 14.0 09/07/2012 0958   LYMPHSABS 0.7 09/07/2012 0958   MONOABS 0.7 09/07/2012 0958   EOSABS 0.4 09/07/2012 0958   BASOSABS 0.0 09/07/2012 0958    BMET    Component Value Date/Time   NA 137 09/15/2012 0949   K 4.0 09/15/2012 0949   CL 102 09/15/2012 0949   CO2 26 09/15/2012 0949   GLUCOSE 107* 09/15/2012 0949   BUN 46* 09/15/2012 0949   CREATININE 2.03* 09/15/2012 0949   CALCIUM 8.9 09/15/2012 0949   GFRNONAA 29* 09/15/2012 0949   GFRAA 34* 09/15/2012 0949    COAGS: Lab Results  Component Value Date   INR 1.1* 09/07/2012   INR 1.30 10/12/2010   INR 1.7 10/05/2009     Non-Invasive Vascular Imaging:     ASSESSMENT: This is a 79 y.o. male with RLE rest pain.  Pt in need of RLE bypass, but there is insufficient amount of vein due to previous surgeries.  PLAN: -pt may need angioplasty of the left ostial SFA.  Dr. Hart Rochester to examine pt and formulate further plan.   Doreatha Massed, PA-C [facility] [phone]  Agree with above assessment Upon talking to patient-am not clear, H. rest pain in the left leg he is really experiencing. He is having severe phantom pain in right BKA stump which is his biggest complaint. Unfortunately his angiogram reveals only distal target the peroneal artery which is only patent and lower half of left lower extremity. Majority of great saphenous vein has been removed with only remaining vein being ankle to knee on the left. He may have left small saphenous vein.  Recommendation would be for Dr. Kennith Maes  to proceed with attempted angioplasty of left SFA ostial stenosis if he feels this is safe and indicated.  We'll obtain vein mapping tomorrow to see if left small saphenous or great saphenous residual vein is satisfactory for short bypass from above-knee popliteal to peroneal artery. If SFA angioplasty is successful  would favor waiting to see how patient's symptoms are and only proceed with an attempt at this bypass graft to the peroneal artery if we are clear the patient is having rest pain or limb threatening ischemia. He comes of lack of good conduit likelihood of long-term success is not good. We'll follow with you

## 2012-09-15 NOTE — CV Procedure (Signed)
PERIPHERAL VASCULAR PROCEDURE  NAME:  [name] Love   MRN: [id number] DOB:  Apr 23, 1929   ADMIT DATE: 09/15/2012  Performing Cardiologist: Lorine Bears Primary Physician: Ignatius Specking., MD   Procedures Performed:  Abdominal Aortic Angiogram with Bi-Iliofemoral Runoff with CO2 angiography  Selective left lower extermity     Indication(s):   Severe rest pain.   Consent: The procedure with Risks/Benefits/Alternatives and Indications was reviewed with the patient .  All questions were answered.  Medications:  Sedation:  1 mg IV Versed, 0 mcg IV Fentanyl  Contrast:  62 mL  Visipaque   Procedural details: The right groin was prepped, draped, and anesthetized with 1% lidocaine. Using modified Seldinger technique, a micropuncture needle and sheath were used to access the right femoral artery which was then exchanged into a 5 French sheath. A 5 Fr Short Pigtail Catheter was advanced of over a  Versicore wire into the descending Aorta to a level just above the renal arteries. Angiography was performed with manual injection of CO2. The pigtail was then pulled back to the distal aorta above the bifurcation. Iliac angiography was performed in the AP, RAO and LAO positions.  The pigtail catheter was changed over the Versicore wire for A crossover catheter which was then pulled back the aortic bifurcation and the wire was advanced down the contralateral left common iliac artery.  The wire was then advanced to the contralateral common femoral artery, the catheter was exchanged into an end hole straight tip catheter which was advanced over the wire to the common femoral artery. Contralateral second-order lower extremity angiography was performed via manual injection of CO2 of followed by contrast.  A pullback was then performed with pressure recording in showed no significant disease in the iliac arteries. There was a maximum of 10 mm systolic gradient across the left iliac  artery.   Hemodynamics:  Central Aortic Pressure / Mean Aortic Pressure: 239/83  Findings:  Abdominal aorta: Normal in size with no evidence of aneurysm. There is mild diffuse atherosclerosis.  Left renal artery: Possible 70-80 % ostial stenosis.  Right renal artery: Mild disease.  Celiac artery: Not well visualized.  Superior mesenteric artery: Appears to be patent.  Right common iliac artery: Minor irregularities.  Right internal iliac artery: Occluded.  Right external iliac artery: Minor irregularities.  Left common iliac artery:  There is a 40% stenosis in the midsegment with only 10 mm mercury systolic gradient.  Left internal iliac artery: Likely occluded.  Left external iliac artery: Minor irregularities with 30-40% stenosis distally.  Left common femoral artery: Normal.  Left profunda femoral artery: Minor irregularities.  Left superficial femoral artery:  Normal in size with 80-90% discrete ostial stenosis. There is diffuse 30% disease throughout the SFA.  Left popliteal artery: Occluded proximally above the knee. There is collateral flow and reconstitution in the peroneal artery which is the only patent vessel below the knee.  Conclusions: 1. Left renal artery stenosis. 2. No significant aortoiliac disease. 3. Severe ostial left SFA stenosis. 4. Occluded left popliteal artery with reconstitution in the peroneal artery which is the only patent vessel below the knee.  Recommendations:  Evaluate for left femoral peroneal bypass. The patient might not be a candidate due to lack of venous conduits. I consulted Dr. Hart Rochester. Angioplasty of the left ostial SFA can be considered if not a candidate for surgery. The patient will be admitted for hydration given significant chronic kidney disease.  Lorine Bears, MD, [facility] 09/15/2012 2:09 PM

## 2012-09-16 DIAGNOSIS — I739 Peripheral vascular disease, unspecified: Secondary | ICD-10-CM

## 2012-09-16 DIAGNOSIS — I10 Essential (primary) hypertension: Secondary | ICD-10-CM

## 2012-09-16 LAB — BASIC METABOLIC PANEL
Calcium: 8.7 mg/dL (ref 8.4–10.5)
GFR calc Af Amer: 37 mL/min — ABNORMAL LOW (ref >90)
GFR calc non Af Amer: 32 mL/min — ABNORMAL LOW (ref >90)
Potassium: 4.2 mEq/L (ref 3.5–5.1)
Sodium: 138 mEq/L (ref 135–145)

## 2012-09-16 LAB — GLUCOSE, CAPILLARY
Glucose-Capillary: 110 mg/dL — ABNORMAL HIGH (ref 70–99)
Glucose-Capillary: 156 mg/dL — ABNORMAL HIGH (ref 70–99)

## 2012-09-16 MED ORDER — SALINE SPRAY 0.65 % NA SOLN
1.0000 | NASAL | Status: DC | PRN
  Administered 2012-09-16 – 2012-09-17 (×2): 1 via NASAL
  Filled 2012-09-16: qty 44

## 2012-09-16 MED ORDER — METOPROLOL TARTRATE 50 MG PO TABS
50.0000 mg | ORAL_TABLET | Freq: Two times a day (BID) | ORAL | Status: DC
  Administered 2012-09-17: 50 mg via ORAL
  Filled 2012-09-16 (×3): qty 1

## 2012-09-16 MED ORDER — CLONIDINE HCL 0.2 MG PO TABS
0.2000 mg | ORAL_TABLET | Freq: Two times a day (BID) | ORAL | Status: DC
  Administered 2012-09-16 – 2012-09-17 (×3): 0.2 mg via ORAL
  Filled 2012-09-16 (×4): qty 1

## 2012-09-16 MED ORDER — SODIUM CHLORIDE 0.9 % IV SOLN
INTRAVENOUS | Status: AC
  Administered 2012-09-16: 10:00:00 via INTRAVENOUS

## 2012-09-16 MED ORDER — SODIUM CHLORIDE 0.9 % IV SOLN
INTRAVENOUS | Status: DC
  Administered 2012-09-16: 08:00:00 via INTRAVENOUS

## 2012-09-16 NOTE — Progress Notes (Signed)
Pt dozing quietly, awakens to light touch, speech clear and appropriate.  BP 246/76 left arm, rechecked 230/78 rt arm.  IV hydralazine and PO clonidine given.  Metoprolol held due to tele afib 48-55 at rest.  99% on RA.  Lungs clear, very diminished left base.  98% on RA.

## 2012-09-16 NOTE — Progress Notes (Addendum)
VASCULAR LAB PRELIMINARY  PRELIMINARY  PRELIMINARY  PRELIMINARY    Left Lower Extremity Vein Map  Left Small Saphenous Vein  Segment Diameter Comment  1. Origin 4.9mm   2. High Calf 2.63mm branch  3. Mid Calf 2.67mm   4. Low Calf 2.25mm   5. Ankle 2.17mm    mm    mm      Bilateral GSV previous harvest. No remnant vein noted. Right LSV cannot be mapped due to BKA.   Farrel Demark, RDMS, RVT  09/16/2012, 11:15 AM

## 2012-09-16 NOTE — Progress Notes (Signed)
Pt states has had almost constant phantom pain in RLE since amputation 2 years ago.  Family states all pain medication makes him "like a stroke victim".  Pt moaning loudly at times from "rt foot" pain, rates 10/10.  Tremors noted of hands and both legs. Repositioning has not helped.  Family discussed w/ MD who ordered to try elavil.  1st dose given at 2000.  Pt received complete relief from pain within couple hours and continues to be pain free at this time.  Legs have much less tremors.  Pt has dozed off and on but has been easy to awaken.  Pt very appreciative of pain relief.  BP 193/66, tele afib 60's.  10 mg IV hydralazine given.  Side rails up, bed alarm on, pt cooperative w/ calling for assist.

## 2012-09-16 NOTE — Progress Notes (Signed)
SUBJECTIVE: c/o right leg phantom pain overnight resolved with Elavil. No chest pain or SOB. No left leg pain.   BP 193/66  Pulse 65  Temp(Src) 97.4 F (36.3 C) (Oral)  Resp 20  Ht 6\' 1"  (1.854 m)  Wt 207 lb 7.3 oz (94.1 kg)  BMI 27.38 kg/m2  SpO2 97%  Intake/Output Summary (Last 24 hours) at 09/16/12 0656 Last data filed at 09/16/12 0321  Gross per 24 hour  Intake 1551.67 ml  Output    950 ml  Net 601.67 ml    PHYSICAL EXAM General: Well developed, well nourished, in no acute distress. Alert and oriented x 3.  Psych:  Good affect, responds appropriately Neck: No JVD. No masses noted.  Lungs: Clear bilaterally with no wheezes or rhonci noted.  Heart: Irreg irreg with no murmurs noted. Abdomen: Bowel sounds are present. Soft, non-tender.  Extremities: 1-2+ left lower extremity edema. Non-palpable pulses left leg. Right BKA.   LABS: Basic Metabolic Panel:  Recent Labs  84/69/62 0949  NA 137  K 4.0  CL 102  CO2 26  GLUCOSE 107*  BUN 46*  CREATININE 2.03*  CALCIUM 8.9   Current Meds: . amitriptyline  25 mg Oral QHS  . aspirin EC  81 mg Oral Daily  . cloNIDine  0.1 mg Oral BID  . ferrous sulfate  325 mg Oral Q breakfast  . glimepiride  2 mg Oral QAC breakfast  . metoprolol  50 mg Oral BID  . pantoprazole  40 mg Oral Daily  . potassium chloride  10 mEq Oral Daily  . simvastatin  40 mg Oral QHS  . vitamin C  500 mg Oral Daily   Distal aortogram: 09/15/12:  Abdominal aorta: Normal in size with no evidence of aneurysm. There is mild diffuse atherosclerosis.  Left renal artery: Possible 70-80 % ostial stenosis.  Right renal artery: Mild disease.  Celiac artery: Not well visualized.  Superior mesenteric artery: Appears to be patent.  Right common iliac artery: Minor irregularities.  Right internal iliac artery: Occluded.  Right external iliac artery: Minor irregularities.  Left common iliac artery: There is a 40% stenosis in the midsegment with only 10 mm  mercury systolic gradient.  Left internal iliac artery: Likely occluded.  Left external iliac artery: Minor irregularities with 30-40% stenosis distally.  Left common femoral artery: Normal.  Left profunda femoral artery: Minor irregularities.  Left superficial femoral artery: Normal in size with 80-90% discrete ostial stenosis. There is diffuse 30% disease throughout the SFA.  Left popliteal artery: Occluded proximally above the knee. There is collateral flow and reconstitution in the peroneal artery which is the only patent vessel below the knee.   ASSESSMENT AND PLAN:  1. PAD: Pt admitted for hydration yesterday after distal aortogram. He is s/p right BKA in 2010 after failed right femoral to posterior tibial bypass grafting. He has phantom pain in the right leg. Now with pain in the left leg with ulcer over left anterior shin. He was found to have severe ostial left SFA stenosis yesterday with occlusion of left popliteal artery with collateral reconstitution of left peroneal artery which is the only runoff to the left foot. He has been seen by Dr. Hart Rochester with [facility]. Plans for vein mapping today to see if left small saphenous or great saphenous residual vein is satisfactory for short bypass from above-knee popliteal to peroneal artery although he would likely not get a good result from a bypass given lack of good conduit. Will  continue gentle hydration today. Will discuss with Dr. Kirke Corin but will most likely d/c home tomorrow and schedule angioplasty of the left SFA at a later date as an outpatient. Elavil for phantom pain today.   2. HTN: BP elevated. Will increase clonidine 0.2 mg po BID. Hydralazine prn.    MCALHANY,CHRISTOPHER  9/4/20146:56 AM

## 2012-09-17 ENCOUNTER — Encounter (HOSPITAL_COMMUNITY): Payer: Self-pay | Admitting: Nurse Practitioner

## 2012-09-17 DIAGNOSIS — I739 Peripheral vascular disease, unspecified: Secondary | ICD-10-CM

## 2012-09-17 MED ORDER — AMITRIPTYLINE HCL 25 MG PO TABS: 25.0000 mg | ORAL_TABLET | Freq: Every evening | ORAL | Status: DC | PRN

## 2012-09-17 MED ORDER — AMLODIPINE BESYLATE 5 MG PO TABS
5.0000 mg | ORAL_TABLET | Freq: Every day | ORAL | Status: DC
  Administered 2012-09-17: 5 mg via ORAL
  Filled 2012-09-17: qty 1

## 2012-09-17 MED ORDER — AMLODIPINE BESYLATE 5 MG PO TABS: 5.0000 mg | ORAL_TABLET | Freq: Every day | ORAL | Status: DC

## 2012-09-17 MED ORDER — ATORVASTATIN CALCIUM 20 MG PO TABS
20.0000 mg | ORAL_TABLET | Freq: Every day | ORAL | Status: DC
  Filled 2012-09-17: qty 1

## 2012-09-17 NOTE — Progress Notes (Signed)
Utilization Review Completed Rockie Schnoor J. Aalyssa Elderkin, RN, BSN, NCM [phone]  

## 2012-09-17 NOTE — Progress Notes (Signed)
    SUBJECTIVE: No complaints.   BP 162/67  Pulse 61  Temp(Src) 98 F (36.7 C) (Oral)  Resp 18  Ht 6\' 1"  (1.854 m)  Wt 208 lb 8.9 oz (94.6 kg)  BMI 27.52 kg/m2  SpO2 96%  Intake/Output Summary (Last 24 hours) at 09/17/12 0649 Last data filed at 09/17/12 0300  Gross per 24 hour  Intake 1075.83 ml  Output    200 ml  Net 875.83 ml    PHYSICAL EXAM General: Well developed, well nourished, in no acute distress. Alert and oriented x 3.  Psych:  Good affect, responds appropriately Neck: No JVD. No masses noted.  Lungs: Clear bilaterally with no wheezes or rhonci noted.  Heart: Irregular with no murmurs noted. Abdomen: Bowel sounds are present. Soft, non-tender.  Extremities: No lower extremity edema. Right BKA. 1+ Left LE edema. Non-palpable pulses left leg.   LABS: Basic Metabolic Panel:  Recent Labs  19/14/78 0949 09/16/12 0545  NA 137 138  K 4.0 4.2  CL 102 103  CO2 26 23  GLUCOSE 107* 100*  BUN 46* 44*  CREATININE 2.03* 1.88*  CALCIUM 8.9 8.7   Current Meds: . amitriptyline  25 mg Oral QHS  . aspirin EC  81 mg Oral Daily  . cloNIDine  0.2 mg Oral BID  . ferrous sulfate  325 mg Oral Q breakfast  . glimepiride  2 mg Oral QAC breakfast  . metoprolol  50 mg Oral BID  . pantoprazole  40 mg Oral Daily  . potassium chloride  10 mEq Oral Daily  . simvastatin  40 mg Oral QHS  . vitamin C  500 mg Oral Daily   ASSESSMENT AND PLAN:  1. PAD: Pt admitted for hydration after distal aortogram. He is s/p right BKA in 2010 after failed right femoral to posterior tibial bypass grafting. He has phantom pain in the right leg. Recent pain in the left leg with ulcer over left anterior shin. He was found to have severe ostial left SFA stenosis with occlusion of left popliteal artery with collateral reconstitution of left peroneal artery which is the only runoff to the left foot. He has been seen by Dr. Hart Rochester with [facility]. Poor vein for conduit. He would likely not get a good result  from a bypass given lack of good conduit. He is stable. Will d/c home today and schedule angioplasty of the left SFA at a later date as an outpatient. Elavil prn for phantom pain   2. HTN: BP still elevated. Will add amlodipine 5 mg po Qdaily.   3. Dispo: D/C home today. Follow up with Dr Kirke Corin 1-2 weeks for repeat BMET and discussion regarding ostial left SFA angioplasty. He will need a prescription for Elavil prn for phantom limb pain and NOrvasc 5 mg po Qdaily.     [name] Love  9/5/20146:49 AM

## 2012-09-17 NOTE — Progress Notes (Signed)
BP down to 159/58.  Pt dozing quietly, awakened easily.  Denies pain.

## 2012-09-17 NOTE — Progress Notes (Signed)
Patient ID: [name] Love, male   DOB: 12-Jul-1929, 79 y.o.   MRN: [id number] Agree with above plan Vein mapping reviewed. Left small saphenous vein is poor conduit for bypass  If patient has clear-cut rest pain and nonhealing ulceration only to consider angioplasty of SFA for inflow and possibly look at arm veins for conduits-agree with discharge Home

## 2012-09-17 NOTE — Discharge Summary (Signed)
Patient ID: [name] Love,  MRN: [id number], DOB/AGE: March 12, 1929 79 y.o.  Admit date: 09/15/2012 Discharge date: 09/17/2012  Primary Care Provider: VYAS,DHRUV B. Primary Cardiologist: Junius Argyle, MD / M. Kirke Corin, MD (PV)  Discharge Diagnoses Principal Problem:   Claudication Active Problems:   PVD   CORONARY ATHEROSCLEROSIS, ARTERY BYPASS GRAFT   Permanent atrial fibrillation   CKD (chronic kidney disease), stage III   DYSLIPIDEMIA   Essential hypertension, benign   S/P BKA (below knee amputation)   Hypothyroidism  Allergies Allergies  Allergen Reactions  . Ace Inhibitors     REACTION: angioedema  . Angiotensin Receptor Blockers     REACTION: angioedema  . Cymbalta [Duloxetine Hcl] Other (See Comments)    Abnormal behavior  . Lisinopril Other (See Comments)    Abnormal behavior   Procedures  Peripheral Angiography 9.3.2014  Hemodynamics:             Central Aortic Pressure / Mean Aortic Pressure: 239/83  Findings:  Abdominal aorta: Normal in size with no evidence of aneurysm. There is mild diffuse atherosclerosis.  Left renal artery: Possible 70-80 % ostial stenosis.  Right renal artery: Mild disease.  Celiac artery: Not well visualized.  Superior mesenteric artery: Appears to be patent.  Right common iliac artery: Minor irregularities.  Right internal iliac artery: Occluded.  Right external iliac artery: Minor irregularities.  Left common iliac artery:  There is a 40% stenosis in the midsegment with only 10 mm mercury systolic gradient.  Left internal iliac artery: Likely occluded.  Left external iliac artery: Minor irregularities with 30-40% stenosis distally.  Left common femoral artery: Normal.  Left profunda femoral artery: Minor irregularities.  Left superficial femoral artery:  Normal in size with 80-90% discrete ostial stenosis. There is diffuse 30% disease throughout the SFA.  Left popliteal artery: Occluded proximally above the knee. There  is collateral flow and reconstitution in the peroneal artery which is the only patent vessel below the knee.  Conclusions: 1. Left renal artery stenosis. 2. No significant aortoiliac disease. 3. Severe ostial left SFA stenosis. 4. Occluded left popliteal artery with reconstitution in the peroneal artery which is the only patent vessel below the knee. _____________  Left Lower Extremity Vein Mapping 9.4.2014  Left Lower Extremity Vein Map  Left Small Saphenous Vein  Segment  Diameter  Comment   1. Origin  4.104mm    2. High Calf  2.37mm  branch   3. Mid Calf  2.70mm    4. Low Calf  2.17mm    5. Ankle  2.39mm     mm     mm    Bilateral GSV previous harvest. No remnant vein noted. Right LSV cannot be mapped due to BKA. _____________   History of Present Illness  79 year old male with prior history of coronary artery disease as well as peripheral arterial disease status post right BKA in the setting of a failed right femoropopliteal bypass with resultant phantom pain. He is recently been followed by Dr. Kirke Corin 4 left lower extremity pain with poor distal circulation. An ABI was performed as an outpatient and was severely reduced at 0.45. Decision was made to pursue diagnostic peripheral angiography.  Hospital Course  Patient presented to the peripheral angiography laboratory at [facility] on September 3 and underwent peripheral angiography revealing l artery stenosis, severe ostial left SFA stenosis, and an occluded left popliteal artery with reconstitution in the peroneal artery which was the only patent vessel below the knee. It was felt that he  may benefit from left femoral to peroneal bypass and therefore patient was observed and vascular surgery was consulted. Vein mapping was undertaken and no remnant vein was noted. Vascular surgery felt that he was a poor surgical candidate and recommended consideration for attempted percutaneous angioplasty of the left SFA.  Patient's renal function  has been stable post procedure. He'll be discharged home today in good condition. We have arranged for outpatient followup with Dr. Kirke Corin next week at which time further discussion will be held regarding possible percutaneous transluminal angioplasty.  Discharge Vitals Blood pressure 160/46, pulse 65, temperature 97.4 F (36.3 C), temperature source Oral, resp. rate 18, height 6\' 1"  (1.854 m), weight 208 lb 8.9 oz (94.6 kg), SpO2 96.00%.  Filed Weights   09/15/12 0749 09/16/12 0000 09/17/12 0400  Weight: 206 lb (93.441 kg) 207 lb 7.3 oz (94.1 kg) 208 lb 8.9 oz (94.6 kg)    Labs  Basic Metabolic Panel  Recent Labs  09/15/12 0949 09/16/12 0545  NA 137 138  K 4.0 4.2  CL 102 103  CO2 26 23  GLUCOSE 107* 100*  BUN 46* 44*  CREATININE 2.03* 1.88*  CALCIUM 8.9 8.7   Disposition  Pt is being discharged home today in good condition.  Follow-up Plans & Appointments  Follow-up Information   Follow up with Dr. Terrilee Files On 09/21/2012. (8:45 AM)    Contact information:   [address] [place] [phone]      Follow up with Ignatius Specking., MD. (as scheduled)    Specialty:  Internal Medicine   Contact information:   [address] [place] [address] [phone]     Discharge Medications    Medication List         acetaminophen 500 MG tablet  Commonly known as:  TYLENOL  Take 500 mg by mouth every 6 (six) hours as needed. For pain     amitriptyline 25 MG tablet  Commonly known as:  ELAVIL  Take 1 tablet (25 mg total) by mouth at bedtime as needed for pain.     amLODipine 5 MG tablet  Commonly known as:  NORVASC  Take 1 tablet (5 mg total) by mouth daily.     aspirin EC 81 MG tablet  Take 1 tablet (81 mg total) by mouth daily.     CALCIUM + D PO  Take 1 tablet by mouth 2 (two) times daily.     cloNIDine 0.2 MG tablet  Commonly known as:  CATAPRES  Take 0.1 mg by mouth 2 (two) times daily.     clopidogrel 75 MG tablet  Commonly known as:  PLAVIX    Take 75 mg by mouth every morning.     ferrous sulfate 325 (65 FE) MG tablet  Take 325 mg by mouth daily with breakfast.     fish oil-omega-3 fatty acids 1000 MG capsule  Take 1 g by mouth daily.     furosemide 40 MG tablet  Commonly known as:  LASIX  Take 40 mg by mouth daily.     glimepiride 2 MG tablet  Commonly known as:  AMARYL  Take 2 mg by mouth daily before breakfast.     metoprolol 50 MG tablet  Commonly known as:  LOPRESSOR  Take 1 tablet (50 mg total) by mouth 2 (two) times daily.     pantoprazole 40 MG tablet  Commonly known as:  PROTONIX  Take 1 tablet (40 mg total) by mouth daily.  potassium chloride 10 MEQ tablet  Commonly known as:  K-DUR  Take 1 tablet by mouth daily.     simvastatin 40 MG tablet  Commonly known as:  ZOCOR  Take 40 mg by mouth at bedtime.     sodium chloride 0.65 % nasal spray  Commonly known as:  OCEAN  Place 1 spray into the nose as needed for congestion. For nasal congestion     vitamin C 500 MG tablet  Commonly known as:  ASCORBIC ACID  Take 500 mg by mouth daily.      Outstanding Labs/Studies  bmet upon f/u next week.  Duration of Discharge Encounter   Greater than 30 minutes including physician time.  Signed, Nicolasa Ducking NP 09/17/2012, 9:33 AM

## 2012-09-17 NOTE — Discharge Summary (Signed)
See full note this am. cdm 

## 2012-09-17 NOTE — Consult Note (Signed)
WOC consult Note Reason for Consult: evaluation of gluteal fold wound.  Pt. Ambulatory with prothesis and walker. Does not report sitting for long periods of time.  Wound type: gluteal skin fold fissure- moisture is typically the etiology Measurement:1.0cm x 0.2cm x 0.1cm, does have some scattered areas that appear to be pink, and re-epithelialized at the current tim. Wound ZOX:WRUE, moist Drainage (amount, consistency, odor) none Periwound:intact Dressing procedure/placement/frequency: soft silicone foam dressings in place currently are appropriate for insulation, and moisture absorption. Pt. Can continue to use these until the fissure is healed, he reports he has a "lady friend" that can check it. If dressings (Allevyn foam) are cost prohibitive he can use zinc based barrier cream. I have explained both to the patient and provided him with dressing to take to local medical supplier if he chooses to obtain these dressings. [facility] in [place] would be able to obtain the foam dressings for the patient, or may have in the store.  Discussed POC with patient and bedside nurse.  Re consult if needed, will not follow at this time. Thanks  Ysidro Ramsay [employer], CWOCN [phone])

## 2012-09-21 ENCOUNTER — Ambulatory Visit (INDEPENDENT_AMBULATORY_CARE_PROVIDER_SITE_OTHER): Payer: Medicare Other | Admitting: Cardiovascular Disease

## 2012-09-21 VITALS — BP 142/66 | HR 55 | Ht 70.0 in | Wt 197.5 lb

## 2012-09-21 DIAGNOSIS — I4821 Permanent atrial fibrillation: Secondary | ICD-10-CM

## 2012-09-21 DIAGNOSIS — I4891 Unspecified atrial fibrillation: Secondary | ICD-10-CM

## 2012-09-21 DIAGNOSIS — I10 Essential (primary) hypertension: Secondary | ICD-10-CM

## 2012-09-21 DIAGNOSIS — I739 Peripheral vascular disease, unspecified: Secondary | ICD-10-CM

## 2012-09-21 LAB — BASIC METABOLIC PANEL
BUN: 39 mg/dL — ABNORMAL HIGH (ref 6–23)
CO2: 28 mEq/L (ref 19–32)
Glucose, Bld: 139 mg/dL — ABNORMAL HIGH (ref 70–99)
Potassium: 4.4 mEq/L (ref 3.5–5.1)
Sodium: 136 mEq/L (ref 135–145)

## 2012-09-21 NOTE — Assessment & Plan Note (Signed)
Although the patient has significant obstructive disease affecting the left lower extremity arterial system, there is no strong indication for revascularization. His pain seems to be reasonably controlled. The small wound is almost healed. There is no other ulceration. Surgical bypass is not an option due to lack of venous conduits and high risk for surgery. Endovascular intervention would not be without risks. Options include treating his ostial left SFA stenosis but that's at the origin of the profunda. Treating the left popliteal artery occlusion will probably be extremely difficult due to very long occlusion .  Continue medical therapy and observation for now. If he develops indication for revascularization, I will likely proceed with left SFA atherectomy and balloon angioplasty first. I will check basic metabolic profile today due to recent contrast use in the setting of advanced chronic kidney disease.

## 2012-09-21 NOTE — Progress Notes (Signed)
HPI  This is an 79 yo male with history of CAD s/p CABG, PAD, permanent atrial fibrillation, and diastolic CHF who is here today for a follow up visit regarding peripheral arterial disease. He had right BKA in 2010 due to failed right fem-pop bypass and now walks with a walker on a prosthetic leg. His main complaint is severe phantom limb pain that has been present since the amputation. He denies exertional dyspnea when walking with his walker. He is not, however, particularly active. No chest pain. No orthopnea or PND. He had severe GI bleeding requiring 3 units PRBCs in 6/13. Xarelto was stopped. A bleeding source was not identified.  He was noted to have poor distal pulses in the left lower extremity . Thus, an ABI was performed which was 0.45 on the left with evidence of multilevel disease. He did have 2 wounds recently on the anterior part of the left shin after he accidentally cut himself with a right leg prosthesis. The first one healed completely. He had another small wound which seems to be healing.  There is significant dependent rubor.  I proceeded with angiography which showed moderate left iliac artery disease, severe 90% ostial left SFA stenosis at the origin of the profunda , and occluded left popliteal artery with reconstitution via collaterals to the right peroneal artery which is the only patent vessel below the knee. I consulted Dr. Hart Rochester to consider femoral-peroneal bypass. However, the patient was found to have no venous conduits. He was started on amitriptyline for Phantom pain on the right side with significant improvement in symptoms. Overall, he has been doing reasonably well.  Allergies  Allergen Reactions  . Ace Inhibitors     REACTION: angioedema  . Angiotensin Receptor Blockers     REACTION: angioedema  . Cymbalta [Duloxetine Hcl] Other (See Comments)    Abnormal behavior  . Lisinopril Other (See Comments)    Abnormal behavior     Current Outpatient Prescriptions  on File Prior to Visit  Medication Sig Dispense Refill  . acetaminophen (TYLENOL) 500 MG tablet Take 500 mg by mouth every 6 (six) hours as needed. For pain      . amitriptyline (ELAVIL) 25 MG tablet Take 1 tablet (25 mg total) by mouth at bedtime as needed for pain.  30 tablet  3  . amLODipine (NORVASC) 5 MG tablet Take 1 tablet (5 mg total) by mouth daily.  30 tablet  6  . aspirin EC 81 MG tablet Take 1 tablet (81 mg total) by mouth daily.      . Calcium Carbonate-Vitamin D (CALCIUM + D PO) Take 1 tablet by mouth 2 (two) times daily.      . cloNIDine (CATAPRES) 0.2 MG tablet Take 0.1 mg by mouth 2 (two) times daily.       . clopidogrel (PLAVIX) 75 MG tablet Take 75 mg by mouth every morning.      . ferrous sulfate 325 (65 FE) MG tablet Take 325 mg by mouth daily with breakfast.      . fish oil-omega-3 fatty acids 1000 MG capsule Take 1 g by mouth daily.       . furosemide (LASIX) 40 MG tablet Take 40 mg by mouth daily.       Marland Kitchen glimepiride (AMARYL) 2 MG tablet Take 2 mg by mouth daily before breakfast.        . metoprolol (LOPRESSOR) 50 MG tablet Take 1 tablet (50 mg total) by mouth 2 (two) times daily.  60 tablet  6  . pantoprazole (PROTONIX) 40 MG tablet Take 1 tablet (40 mg total) by mouth daily.  30 tablet  11  . potassium chloride (K-DUR) 10 MEQ tablet Take 1 tablet by mouth daily.      . simvastatin (ZOCOR) 40 MG tablet Take 40 mg by mouth at bedtime.        . sodium chloride (OCEAN) 0.65 % nasal spray Place 1 spray into the nose as needed for congestion. For nasal congestion      . vitamin C (ASCORBIC ACID) 500 MG tablet Take 500 mg by mouth daily.       No current facility-administered medications on file prior to visit.     Past Medical History  Diagnosis Date  . Coronary artery disease     Status post coronary bypass grafting February 2009  . Dyslipidemia   . Hypertension   . Renal insufficiency     GFR 43 meals per minute April 2012  . Hypoalbuminemia   . Obesity      lungs pending essential hypertension  . Peripheral vascular disease     a. s/p prior R fem-pop followed by BKA;  b. 09/2012 Periph Angio: L RAS, no signif ao/il dzs, sev ostial LSFA stenosis, 100 l pop w reconst in peroneal which is only patent vessel BK -->Seen by Vasc Surg- poor surg candidate.  . Phantom limb pain   . Anginal pain   . Arrhythmia     post op atrial fibrillation,treated with amiodarone  . Atrial fibrillation     a. off anticoagulation 2/2 gib on xarelto  . NSTEMI (non-ST elevated myocardial infarction) 02/2007    "during operation" (09/15/2012)  . Exertional shortness of breath     "at my age": (09/15/2012)  . Type II diabetes mellitus   . History of blood transfusion     "after they put him on blood thinner; got 3 units; stopped RX; hasn't had problem since" (09/15/2012)  . GERD (gastroesophageal reflux disease)     "use to have it real bad; no problem lately" (09/15/2012)  . Arthritis     "legs and arms" (09/15/2012)     Past Surgical History  Procedure Laterality Date  . Cholecystectomy    . Abdominal aortagram  09/15/2012  . Below knee leg amputation Right 02/2010  . Coronary artery bypass graft  03/10/2007    4 vessel  . Inguinal hernia repair    . Appendectomy  ~ 1938  . Total hip arthroplasty Right 2012  . Joint replacement    . Femoral bypass Right 02/04/2008    PVD Dr.Greg Burna Mortimer pt bypass with gortex; occluded by doppler 08/2008  . Vascular surgery      PVD Dr.Greg Madilyn Fireman right femto pt bypass with gortex 02/04/2008 occluded by doppler 08/2008  . Cataract extraction w/ intraocular lens  implant, bilateral Bilateral 07/2012  . Coronary angioplasty       No family history on file.   History   Social History  . Marital Status: Married    Spouse Name: N/A    Number of Children: N/A  . Years of Education: N/A   Occupational History  . 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 Smoking status:  Never Smoker   . Smokeless tobacco: Never Used  . Alcohol Use: No  . Drug Use: No  . Sexual Activity: Yes   Other Topics Concern  .  Not on file   Social History Narrative  . No narrative on file    PHYSICAL EXAM   BP 142/66  Pulse 55  Ht 5\' 10"  (1.778 m)  Wt 197 lb 8 oz (89.585 kg)  BMI 28.34 kg/m2  Constitutional: He is oriented to person, place, and time. He appears frail but well-nourished. No distress.  HENT: No nasal discharge.  Head: Normocephalic and atraumatic.  Eyes: Pupils are equal and round.  Neck: Normal range of motion. Neck supple. No JVD present. No thyromegaly present.  Cardiovascular: Normal rate, irregular rhythm, normal heart sounds and. Exam reveals no gallop and no friction rub. Pulmonary/Chest: Effort normal and breath sounds normal. No stridor. No respiratory distress. He has no wheezes. He has no rales. He exhibits no tenderness.  Abdominal: Soft. Bowel sounds are normal. He exhibits no distension. There is no tenderness. There is no rebound and no guarding.  Musculoskeletal: Normal range of motion. He exhibits +1 edema and no tenderness.  Neurological: He is alert and oriented to person, place, and time. Coordination normal.  Skin: Skin is warm and dry. Chronic stasis dermatitis. He is not diaphoretic. No erythema. No pallor.  Psychiatric: He has a normal mood and affect. His behavior is normal. Judgment and thought content normal.  Vascular: Femoral pulse: normal on the right side and slightly diminished on the left side . left foot is slightly cold.. Left popliteal pulses not palpable. Distal pulses are not palpable. There is dependent rubor.  No groin hematoma    ASSESSMENT AND PLAN

## 2012-09-21 NOTE — Patient Instructions (Addendum)
**Note De-identified [name] Love Obfuscation** Your physician recommends that you continue on your current medications as directed. Please refer to the Current Medication list given to you today.  Your physician recommends that you return for lab work in: today  Your physician recommends that you schedule a follow-up appointment in: 3 months   

## 2012-09-21 NOTE — Assessment & Plan Note (Signed)
Ventricular rate is controlled. He is off anticoagulation due to GI bleed. He is tolerating aspirin and Plavix.

## 2012-09-23 ENCOUNTER — Ambulatory Visit (INDEPENDENT_AMBULATORY_CARE_PROVIDER_SITE_OTHER): Payer: Medicare Other | Admitting: Cardiovascular Disease

## 2012-09-23 ENCOUNTER — Encounter: Payer: Self-pay | Admitting: Cardiovascular Disease

## 2012-09-23 VITALS — BP 108/64 | HR 53 | Ht 73.0 in | Wt 205.8 lb

## 2012-09-23 DIAGNOSIS — I509 Heart failure, unspecified: Secondary | ICD-10-CM

## 2012-09-23 DIAGNOSIS — I5030 Unspecified diastolic (congestive) heart failure: Secondary | ICD-10-CM

## 2012-09-23 DIAGNOSIS — N289 Disorder of kidney and ureter, unspecified: Secondary | ICD-10-CM

## 2012-09-23 DIAGNOSIS — R609 Edema, unspecified: Secondary | ICD-10-CM

## 2012-09-23 DIAGNOSIS — E785 Hyperlipidemia, unspecified: Secondary | ICD-10-CM

## 2012-09-23 DIAGNOSIS — I25810 Atherosclerosis of coronary artery bypass graft(s) without angina pectoris: Secondary | ICD-10-CM

## 2012-09-23 DIAGNOSIS — I4891 Unspecified atrial fibrillation: Secondary | ICD-10-CM

## 2012-09-23 DIAGNOSIS — I739 Peripheral vascular disease, unspecified: Secondary | ICD-10-CM

## 2012-09-23 DIAGNOSIS — Z79899 Other long term (current) drug therapy: Secondary | ICD-10-CM

## 2012-09-23 DIAGNOSIS — I10 Essential (primary) hypertension: Secondary | ICD-10-CM

## 2012-09-23 DIAGNOSIS — I4821 Permanent atrial fibrillation: Secondary | ICD-10-CM

## 2012-09-23 MED ORDER — METOPROLOL TARTRATE 25 MG PO TABS: 25.0000 mg | ORAL_TABLET | Freq: Two times a day (BID) | ORAL | Status: DC

## 2012-09-23 NOTE — Patient Instructions (Signed)
   Decrease Metoprolol to 25mg  twice a day - new sent to pharm  Continue all other medications.   Your physician wants you to follow up in: 6 months.  You will receive a reminder letter in the mail one-two months in advance.  If you don't receive a letter, please call our office to schedule the follow up appointment

## 2012-09-23 NOTE — Progress Notes (Signed)
Patient ID: [name] Love, male   DOB: September 29, 1929, 79 y.o.   MRN: [id number]   SUBJECTIVE: [name] is an 79 yo male with history of CAD s/p CABG, PAD, permanent atrial fibrillation, and diastolic CHF. He underwent a right BKA in 2010 due to failed right fem-pop bypass and now walks with a walker on a prosthetic leg. He denies exertional dyspnea when walking with his walker. He is not, however, particularly active. He denies chest pain, orthopnea and PND. He had severe GI bleeding requiring 3 units PRBCs in 6/13. Xarelto was stopped, and a bleeding source was not identified. He has significant left lower extremity PVD which is being managed by Dr. Kirke Corin, and he was deemed to be a high risk surgical candidate with not many feasible options for endovascular intervention. He takes Amitriptyline for phantom limb pain on the right, which has relieved it to some degree. He has been tolerating ASA and Plavix.  He gets dizzy from time to time, but denies falls and syncope.  Echo in May 2014 revealed the following:  - Left ventricle: The cavity size was normal. There was moderate concentric hypertrophy. Systolic function was vigorous. The estimated ejection fraction was in the range of 65% to 70%. Wall motion was normal; there were no regional wall motion abnormalities. The study is not technically sufficient to allow evaluation of LV diastolic function. Atrial fibrillation present. No tissue Doppler obtained. Unable to compare directly with the previous study. - Aortic valve: Mildly calcified annulus. Trileaflet; mildly thickened, mildly calcified leaflets. Mean gradient: 3mm Hg (S). Valve area: 2.8cm^2(VTI). - Mitral valve: Calcified annulus. Mildly thickened leaflets. Trivial regurgitation. - Left atrium: The atrium was mildly dilated. - Right ventricle: The cavity size was mildly dilated. The moderator band was prominent. Systolic function was mildly reduced. - Tricuspid valve: Mild  regurgitation. Peak RV-RA gradient: 22mm Hg (S).      Allergies  Allergen Reactions  . Ace Inhibitors     REACTION: angioedema  . Angiotensin Receptor Blockers     REACTION: angioedema  . Cymbalta [Duloxetine Hcl] Other (See Comments)    Abnormal behavior  . Lisinopril Other (See Comments)    Abnormal behavior    Current Outpatient Prescriptions  Medication Sig Dispense Refill  . acetaminophen (TYLENOL) 500 MG tablet Take 500 mg by mouth every 6 (six) hours as needed. For pain      . amitriptyline (ELAVIL) 25 MG tablet Take 1 tablet (25 mg total) by mouth at bedtime as needed for pain.  30 tablet  3  . amLODipine (NORVASC) 5 MG tablet Take 1 tablet (5 mg total) by mouth daily.  30 tablet  6  . aspirin EC 81 MG tablet Take 1 tablet (81 mg total) by mouth daily.      . Calcium Carbonate-Vitamin D (CALCIUM + D PO) Take 1 tablet by mouth 2 (two) times daily.      . cloNIDine (CATAPRES) 0.2 MG tablet Take 0.1 mg by mouth 2 (two) times daily.       . clopidogrel (PLAVIX) 75 MG tablet Take 75 mg by mouth every morning.      . ferrous sulfate 325 (65 FE) MG tablet Take 325 mg by mouth daily with breakfast.      . fish oil-omega-3 fatty acids 1000 MG capsule Take 1 g by mouth daily.       . furosemide (LASIX) 40 MG tablet Take 40 mg by mouth daily.       Marland Kitchen glimepiride (  AMARYL) 2 MG tablet Take 2 mg by mouth daily before breakfast.        . metoprolol (LOPRESSOR) 50 MG tablet Take 1 tablet (50 mg total) by mouth 2 (two) times daily.  60 tablet  6  . pantoprazole (PROTONIX) 40 MG tablet Take 1 tablet (40 mg total) by mouth daily.  30 tablet  11  . potassium chloride (K-DUR) 10 MEQ tablet Take 1 tablet by mouth daily.      . simvastatin (ZOCOR) 40 MG tablet Take 40 mg by mouth at bedtime.        . sodium chloride (OCEAN) 0.65 % nasal spray Place 1 spray into the nose as needed for congestion. For nasal congestion      . vitamin C (ASCORBIC ACID) 500 MG tablet Take 500 mg by mouth daily.        No current facility-administered medications for this visit.    Past Medical History  Diagnosis Date  . Coronary artery disease     Status post coronary bypass grafting February 2009  . Dyslipidemia   . Hypertension   . Renal insufficiency     GFR 43 meals per minute April 2012  . Hypoalbuminemia   . Obesity     lungs pending essential hypertension  . Peripheral vascular disease     a. s/p prior R fem-pop followed by BKA;  b. 09/2012 Periph Angio: L RAS, no signif ao/il dzs, sev ostial LSFA stenosis, 100 l pop w reconst in peroneal which is only patent vessel BK -->Seen by Vasc Surg- poor surg candidate.  . Phantom limb pain   . Anginal pain   . Arrhythmia     post op atrial fibrillation,treated with amiodarone  . Atrial fibrillation     a. off anticoagulation 2/2 gib on xarelto  . NSTEMI (non-ST elevated myocardial infarction) 02/2007    "during operation" (09/15/2012)  . Exertional shortness of breath     "at my age": (09/15/2012)  . Type II diabetes mellitus   . History of blood transfusion     "after they put him on blood thinner; got 3 units; stopped RX; hasn't had problem since" (09/15/2012)  . GERD (gastroesophageal reflux disease)     "use to have it real bad; no problem lately" (09/15/2012)  . Arthritis     "legs and arms" (09/15/2012)    Past Surgical History  Procedure Laterality Date  . Cholecystectomy    . Abdominal aortagram  09/15/2012  . Below knee leg amputation Right 02/2010  . Coronary artery bypass graft  03/10/2007    4 vessel  . Inguinal hernia repair    . Appendectomy  ~ 1938  . Total hip arthroplasty Right 2012  . Joint replacement    . Femoral bypass Right 02/04/2008    PVD Dr.Greg Burna Mortimer pt bypass with gortex; occluded by doppler 08/2008  . Vascular surgery      PVD Dr.Greg Madilyn Fireman right femto pt bypass with gortex 02/04/2008 occluded by doppler 08/2008  . Cataract extraction w/ intraocular lens  implant, bilateral Bilateral 07/2012  . Coronary  angioplasty      History   Social History  . Marital Status: Married    Spouse Name: N/A    Number of Children: N/A  . Years of Education: N/A   Occupational History  . 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 Smoking status: Never Smoker   .  Smokeless tobacco: Never Used  . Alcohol Use: No  . Drug Use: No  . Sexual Activity: Yes   Other Topics Concern  . Not on file   Social History Narrative  . No narrative on file     Filed Vitals:   09/23/12 0820  BP: 108/64  Pulse: 53  Height: 6\' 1"  (1.854 m)  Weight: 205 lb 12.8 oz (93.35 kg)  SpO2: 98%    PHYSICAL EXAM General: NAD Neck: No JVD, no thyromegaly or thyroid nodule.  Lungs: Clear to auscultation bilaterally with normal respiratory effort. CV: Nondisplaced PMI.  Heart irregular rhythm but rate is controlled, normal S1/S2, no S3/S4, no murmur.  No peripheral edema.  No carotid bruit. Diminished pedal pulses on left. Abdomen: Soft, nontender, no hepatosplenomegaly, no distention.  Neurologic: Alert and oriented x 3.  Psych: Normal affect. Extremities: No clubbing or cyanosis. Left leg is erythematous but not warm, with trace pitting edema.  ECG: reviewed and available in electronic records.      ASSESSMENT AND PLAN: Reduce Metop to 25 bid for dizziness  1. Atrial fibrillation: Permanent. He is not on anticoagulation since he had life-threatening GI bleeding in 6/13. No definite bleeding source was identified at the time per his report. I am going to leave him off anticoagulation given this history. HR is well controlled. However, I will reduce Metoprolol to 25 mg bid to help alleviate his dizziness, which is likely related to bradycardia primarily, and possibly relative hypotension. 2. CAD: History of CABG. No recent chest pain or significant dyspnea. Continue ASA, Plavix, and statin.  3. Hyperlipidemia: In May 2014: TC 100, TG 86, HDL 27, LDL 56.  Continue Simvastatin 40 mg daily. 4. HTN: BP is slightly low today. Will continue Amlodipine 5 mg daily for now, and reduce Metoprolol as mentioned above. 5. Chronic diastolic CHF: He seems near-euvolemic on current Lasix dose. Will continue this. His LV systolic function is vigorous. 6. PAD: Managed by Dr. Kirke Corin. 7. Phantom limb pain: At right BKA site, and using Amitriptyline. 8. CKD: stable.      Prentice Docker, M.D., F.A.C.C.

## 2012-12-28 ENCOUNTER — Ambulatory Visit: Payer: Medicare Other | Admitting: Cardiovascular Disease

## 2012-12-29 ENCOUNTER — Telehealth: Payer: Self-pay | Admitting: Cardiovascular Disease

## 2012-12-29 NOTE — Telephone Encounter (Signed)
New message    Need fax from pain management clinic (Dr Sol Passer) to stop plavix while pt get injections.  Did we get the fax?

## 2012-12-29 NOTE — Telephone Encounter (Signed)
OK to hold Plavix for 5-7 days before procedure and resume after.

## 2012-12-29 NOTE — Telephone Encounter (Signed)
I will fax this note to [phone].

## 2012-12-29 NOTE — Telephone Encounter (Signed)
Fax received from [facility] (Dr Laurian Brim). Pt needs injection to L4-5 on 01/11/13 and needs approval to hold plavix 5-7 days prior to procedure.  I will forward this message to Dr Kirke Corin to review and determine if the pt can hold plavix.

## 2013-01-25 ENCOUNTER — Encounter: Payer: Self-pay | Admitting: *Deleted

## 2013-01-25 ENCOUNTER — Encounter: Payer: Self-pay | Admitting: Cardiovascular Disease

## 2013-01-25 ENCOUNTER — Ambulatory Visit (INDEPENDENT_AMBULATORY_CARE_PROVIDER_SITE_OTHER): Payer: Medicare Other | Admitting: Cardiovascular Disease

## 2013-01-25 VITALS — BP 183/64 | HR 48 | Ht 73.0 in | Wt 204.0 lb

## 2013-01-25 DIAGNOSIS — I739 Peripheral vascular disease, unspecified: Secondary | ICD-10-CM

## 2013-01-25 NOTE — Patient Instructions (Signed)
Your physician wants you to follow-up in 6 months with Dr. Kirke CorinArida.    You will receive a reminder letter in the mail two months in advance. If you don't receive a letter, please call our office to schedule the follow-up appointment.  May stop Plavix 7 days prior to procedure.

## 2013-01-25 NOTE — Assessment & Plan Note (Signed)
He seems to be doing reasonably well with no significant pain affecting the left leg and no lower extremity ulceration. I recommend continuing medical therapy and reserving ostial left SFA atherectomy for worsening leg ischemia. Ok to use support hose but not to exceed 20-30 mm Hg with close monitoring for worsening ischemia.  Continue dual antiplatelet therapy. Plavix can be held 7 days for planned back injection or other procedures.

## 2013-01-25 NOTE — Progress Notes (Signed)
HPI  This is an 80 yo male with history of CAD s/p CABG, PAD, permanent atrial fibrillation, and diastolic CHF who is here today for a follow up visit regarding peripheral arterial disease. He had right BKA in 2010 due to failed right fem-pop bypass and now walks with a walker on a prosthetic leg. He had severe phantom limb pain since then.  He had severe GI bleeding requiring 3 units PRBCs in 6/13. Xarelto was stopped. A bleeding source was not identified.  He was seen in July, 2014 for slow healing ulcers on left leg.  ABI  was 0.45 on the left with evidence of multilevel disease. I proceeded with angiography which showed moderate left iliac artery disease, severe 90% ostial left SFA stenosis at the origin of the profunda , and occluded left popliteal artery with reconstitution via collaterals in the peroneal artery which was  the only patent vessel below the knee. I consulted Dr. Hart Rochester to consider femoral-peroneal bypass. However, the patient was found to have no venous conduits. He was started on amitriptyline for Phantom pain on the right side with significant improvement in symptoms. However, this was discontinued due to side effects.  He has been doing reasonably well. He denies significant discomfort in the left leg. He continues to have phantom pain in the right leg. The wounds in the left leg healed. He has chronic lower extremity edema which has improved with support hose. He will be undergoing back injections   Allergies  Allergen Reactions  . Ace Inhibitors     REACTION: angioedema  . Amitriptyline     Comatose symptoms, per pt's daughter.    . Angiotensin Receptor Blockers     REACTION: angioedema  . Cymbalta [Duloxetine Hcl] Other (See Comments)    Abnormal behavior  . Lisinopril Other (See Comments)    Abnormal behavior  . Vicodin [Hydrocodone-Acetaminophen]     Comatose symptoms, per pt's daughter.     Current Outpatient Prescriptions on File Prior to Visit    Medication Sig Dispense Refill  . amLODipine (NORVASC) 5 MG tablet Take 1 tablet (5 mg total) by mouth daily.  30 tablet  6  . aspirin EC 81 MG tablet Take 1 tablet (81 mg total) by mouth daily.      . Calcium Carbonate-Vitamin D (CALCIUM + D PO) Take 1 tablet by mouth 2 (two) times daily.      . clopidogrel (PLAVIX) 75 MG tablet Take 75 mg by mouth every morning.      . ferrous sulfate 325 (65 FE) MG tablet Take 325 mg by mouth daily with breakfast.      . fish oil-omega-3 fatty acids 1000 MG capsule Take 1 g by mouth daily.       . furosemide (LASIX) 40 MG tablet Take 40 mg by mouth daily.       Marland Kitchen glimepiride (AMARYL) 2 MG tablet Take 2 mg by mouth daily before breakfast.        . metoprolol (LOPRESSOR) 25 MG tablet Take 1 tablet (25 mg total) by mouth 2 (two) times daily.  60 tablet  6  . pantoprazole (PROTONIX) 40 MG tablet Take 1 tablet (40 mg total) by mouth daily.  30 tablet  11  . potassium chloride (K-DUR) 10 MEQ tablet Take 1 tablet by mouth daily.      . simvastatin (ZOCOR) 40 MG tablet Take 40 mg by mouth at bedtime.        . vitamin C (ASCORBIC  ACID) 500 MG tablet Take 500 mg by mouth daily.      Marland Kitchen. acetaminophen (TYLENOL) 500 MG tablet Take 500 mg by mouth every 6 (six) hours as needed. For pain       No current facility-administered medications on file prior to visit.     Past Medical History  Diagnosis Date  . Coronary artery disease     Status post coronary bypass grafting February 2009  . Dyslipidemia   . Hypertension   . Renal insufficiency     GFR 43 meals per minute April 2012  . Hypoalbuminemia   . Obesity     lungs pending essential hypertension  . Peripheral vascular disease     a. s/p prior R fem-pop followed by BKA;  b. 09/2012 Periph Angio: L RAS, no signif ao/il dzs, sev ostial LSFA stenosis, 100 l pop w reconst in peroneal which is only patent vessel BK -->Seen by Vasc Surg- poor surg candidate.  . Phantom limb pain   . Anginal pain   . Arrhythmia      post op atrial fibrillation,treated with amiodarone  . Atrial fibrillation     a. off anticoagulation 2/2 gib on xarelto  . NSTEMI (non-ST elevated myocardial infarction) 02/2007    "during operation" (09/15/2012)  . Exertional shortness of breath     "at my age": (09/15/2012)  . Type II diabetes mellitus   . History of blood transfusion     "after they put him on blood thinner; got 3 units; stopped RX; hasn't had problem since" (09/15/2012)  . GERD (gastroesophageal reflux disease)     "use to have it real bad; no problem lately" (09/15/2012)  . Arthritis     "legs and arms" (09/15/2012)     Past Surgical History  Procedure Laterality Date  . Cholecystectomy    . Abdominal aortagram  09/15/2012  . Below knee leg amputation Right 02/2010  . Coronary artery bypass graft  03/10/2007    4 vessel  . Inguinal hernia repair    . Appendectomy  ~ 1938  . Total hip arthroplasty Right 2012  . Joint replacement    . Femoral bypass Right 02/04/2008    PVD Dr.Greg Burna MortimerHayes  femto pt bypass with gortex; occluded by doppler 08/2008  . Vascular surgery      PVD Dr.Greg Madilyn FiremanHayes right femto pt bypass with gortex 02/04/2008 occluded by doppler 08/2008  . Cataract extraction w/ intraocular lens  implant, bilateral Bilateral 07/2012  . Coronary angioplasty       No family history on file.   History   Social History  . Marital Status: Married    Spouse Name: N/A    Number of Children: N/A  . Years of Education: N/A   Occupational History  . 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 Smoking status: Never Smoker   . Smokeless tobacco: Never Used  . Alcohol Use: No  . Drug Use: No  . Sexual Activity: Yes   Other Topics Concern  . Not on file   Social History Narrative  . No narrative on file    PHYSICAL EXAM   BP 183/64  Pulse 48  Ht 6\' 1"  (1.854 m)  Wt 204 lb (92.534 kg)  BMI 26.92 kg/m2  Constitutional: He is oriented to person,  place, and time. He appears frail but well-nourished. No distress.  HENT: No nasal discharge.  Head: Normocephalic and  atraumatic.  Eyes: Pupils are equal and round.  Neck: Normal range of motion. Neck supple. No JVD present. No thyromegaly present.  Cardiovascular: Normal rate, irregular rhythm, normal heart sounds and. Exam reveals no gallop and no friction rub. Pulmonary/Chest: Effort normal and breath sounds normal. No stridor. No respiratory distress. He has no wheezes. He has no rales. He exhibits no tenderness.  Abdominal: Soft. Bowel sounds are normal. He exhibits no distension. There is no tenderness. There is no rebound and no guarding.  Musculoskeletal: Normal range of motion. He exhibits +1 edema and no tenderness.  Neurological: He is alert and oriented to person, place, and time. Coordination normal.  Skin: Skin is warm and dry. Chronic stasis dermatitis. He is not diaphoretic. No erythema. No pallor.  Psychiatric: He has a normal mood and affect. His behavior is normal. Judgment and thought content normal.  Vascular: Femoral pulse: normal on the right side and slightly diminished on the left side . Marland Kitchen Left popliteal pulses not palpable. Distal pulses are not palpable. There is dependent rubor.     ASSESSMENT AND PLAN

## 2013-03-28 ENCOUNTER — Ambulatory Visit (INDEPENDENT_AMBULATORY_CARE_PROVIDER_SITE_OTHER): Payer: Medicare Other | Admitting: Cardiovascular Disease

## 2013-03-28 ENCOUNTER — Encounter: Payer: Self-pay | Admitting: Cardiovascular Disease

## 2013-03-28 VITALS — BP 170/89 | HR 45 | Ht 73.0 in | Wt 204.0 lb

## 2013-03-28 DIAGNOSIS — G547 Phantom limb syndrome without pain: Secondary | ICD-10-CM

## 2013-03-28 DIAGNOSIS — R609 Edema, unspecified: Secondary | ICD-10-CM

## 2013-03-28 DIAGNOSIS — E785 Hyperlipidemia, unspecified: Secondary | ICD-10-CM

## 2013-03-28 DIAGNOSIS — Z79899 Other long term (current) drug therapy: Secondary | ICD-10-CM

## 2013-03-28 DIAGNOSIS — I4891 Unspecified atrial fibrillation: Secondary | ICD-10-CM

## 2013-03-28 DIAGNOSIS — I5030 Unspecified diastolic (congestive) heart failure: Secondary | ICD-10-CM

## 2013-03-28 DIAGNOSIS — I25810 Atherosclerosis of coronary artery bypass graft(s) without angina pectoris: Secondary | ICD-10-CM

## 2013-03-28 DIAGNOSIS — I10 Essential (primary) hypertension: Secondary | ICD-10-CM

## 2013-03-28 DIAGNOSIS — I509 Heart failure, unspecified: Secondary | ICD-10-CM

## 2013-03-28 DIAGNOSIS — I4821 Permanent atrial fibrillation: Secondary | ICD-10-CM

## 2013-03-28 DIAGNOSIS — N289 Disorder of kidney and ureter, unspecified: Secondary | ICD-10-CM

## 2013-03-28 DIAGNOSIS — I739 Peripheral vascular disease, unspecified: Secondary | ICD-10-CM

## 2013-03-28 DIAGNOSIS — G546 Phantom limb syndrome with pain: Secondary | ICD-10-CM

## 2013-03-28 MED ORDER — AMLODIPINE BESYLATE 10 MG PO TABS: 10.0000 mg | ORAL_TABLET | Freq: Every day | ORAL | Status: AC

## 2013-03-28 MED ORDER — METOPROLOL TARTRATE 25 MG PO TABS: 12.5000 mg | ORAL_TABLET | Freq: Two times a day (BID) | ORAL | Status: DC

## 2013-03-28 MED ORDER — CLONIDINE HCL 0.1 MG PO TABS: 0.1000 mg | ORAL_TABLET | Freq: Two times a day (BID) | ORAL | Status: AC

## 2013-03-28 NOTE — Progress Notes (Signed)
Patient ID: [name] Love, male   DOB: 10/28/1929, 80 y.o.   MRN: [id number]      SUBJECTIVE: [name] Love is an 80 yo male with history of CAD s/p CABG, PAD, permanent atrial fibrillation, and diastolic CHF. He underwent a right BKA in 2010 due to failed right fem-pop bypass and now walks with a walker on a prosthetic leg.  He had severe GI bleeding requiring 3 units PRBCs in 6/13. Xarelto was stopped, and a bleeding source was not identified.  He has significant left lower extremity PVD which is currently being medically managed by Dr. Kirke Corin. He had side effects related to amitriptyline. He denies exertional dyspnea, chest pain, dizziness, lightheadedness orthopnea and PND.  He had some left foot and leg swelling yesterday for which he took an extra dose of Lasix with resolution. He continues to tolerate ASA and Plavix.         Allergies  Allergen Reactions  . Ace Inhibitors     REACTION: angioedema  . Amitriptyline     Comatose symptoms, per pt's daughter.    . Angiotensin Receptor Blockers     REACTION: angioedema  . Cymbalta [Duloxetine Hcl] Other (See Comments)    Abnormal behavior  . Lisinopril Other (See Comments)    Abnormal behavior  . Vicodin [Hydrocodone-Acetaminophen]     Comatose symptoms, per pt's daughter.    Current Outpatient Prescriptions  Medication Sig Dispense Refill  . acetaminophen (TYLENOL) 500 MG tablet Take 500 mg by mouth every 6 (six) hours as needed. For pain      . amLODipine (NORVASC) 5 MG tablet Take 1 tablet (5 mg total) by mouth daily.  30 tablet  6  . aspirin EC 81 MG tablet Take 1 tablet (81 mg total) by mouth daily.      . Calcium Carbonate-Vitamin D (CALCIUM + D PO) Take 1 tablet by mouth 2 (two) times daily.      . cloNIDine (CATAPRES) 0.2 MG tablet Take 0.2 mg by mouth 2 (two) times daily. 1/2 tab twice daily.      . clopidogrel (PLAVIX) 75 MG tablet Take 75 mg by mouth every morning.      . ferrous sulfate 325 (65 FE) MG tablet Take  325 mg by mouth daily with breakfast.      . fish oil-omega-3 fatty acids 1000 MG capsule Take 1 g by mouth daily.       . furosemide (LASIX) 40 MG tablet Take 40 mg by mouth daily.       Marland Kitchen glimepiride (AMARYL) 2 MG tablet Take 2 mg by mouth daily before breakfast.        . lidocaine (LIDODERM) 5 % Place 1 patch onto the skin every 12 (twelve) hours as needed. Remove & Discard patch within 12 hours or as directed by MD      . metoprolol (LOPRESSOR) 25 MG tablet Take 1 tablet (25 mg total) by mouth 2 (two) times daily.  60 tablet  6  . pantoprazole (PROTONIX) 40 MG tablet Take 1 tablet (40 mg total) by mouth daily.  30 tablet  11  . potassium chloride (K-DUR) 10 MEQ tablet Take 1 tablet by mouth daily.      . simvastatin (ZOCOR) 40 MG tablet Take 40 mg by mouth at bedtime.        . traMADol (ULTRAM) 50 MG tablet 50 mg daily. 1/2 tab in the am, 1/2 in 8 hrs, 1 tablet at bedtime.      Marland Kitchen  vitamin C (ASCORBIC ACID) 500 MG tablet Take 500 mg by mouth daily.       No current facility-administered medications for this visit.    Past Medical History  Diagnosis Date  . Coronary artery disease     Status post coronary bypass grafting February 2009  . Dyslipidemia   . Hypertension   . Renal insufficiency     GFR 43 meals per minute April 2012  . Hypoalbuminemia   . Obesity     lungs pending essential hypertension  . Peripheral vascular disease     a. s/p prior R fem-pop followed by BKA;  b. 09/2012 Periph Angio: L RAS, no signif ao/il dzs, sev ostial LSFA stenosis, 100 l pop w reconst in peroneal which is only patent vessel BK -->Seen by Vasc Surg- poor surg candidate.  . Phantom limb pain   . Anginal pain   . Arrhythmia     post op atrial fibrillation,treated with amiodarone  . Atrial fibrillation     a. off anticoagulation 2/2 gib on xarelto  . NSTEMI (non-ST elevated myocardial infarction) 02/2007    "during operation" (09/15/2012)  . Exertional shortness of breath     "at my age": (09/15/2012)    . Type II diabetes mellitus   . History of blood transfusion     "after they put him on blood thinner; got 3 units; stopped RX; hasn't had problem since" (09/15/2012)  . GERD (gastroesophageal reflux disease)     "use to have it real bad; no problem lately" (09/15/2012)  . Arthritis     "legs and arms" (09/15/2012)    Past Surgical History  Procedure Laterality Date  . Cholecystectomy    . Abdominal aortagram  09/15/2012  . Below knee leg amputation Right 02/2010  . Coronary artery bypass graft  03/10/2007    4 vessel  . Inguinal hernia repair    . Appendectomy  ~ 1938  . Total hip arthroplasty Right 2012  . Joint replacement    . Femoral bypass Right 02/04/2008    PVD Dr.Greg Burna Mortimer pt bypass with gortex; occluded by doppler 08/2008  . Vascular surgery      PVD Dr.Greg Madilyn Fireman right femto pt bypass with gortex 02/04/2008 occluded by doppler 08/2008  . Cataract extraction w/ intraocular lens  implant, bilateral Bilateral 07/2012  . Coronary angioplasty      History   Social History  . Marital Status: Married    Spouse Name: N/A    Number of Children: N/A  . Years of Education: N/A   Occupational History  . 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 Smoking status: Never Smoker   . Smokeless tobacco: Never Used  . Alcohol Use: No  . Drug Use: No  . Sexual Activity: Yes   Other Topics Concern  . Not on file   Social History Narrative  . No narrative on file     Filed Vitals:   03/28/13 1047  Height: 6\' 1"  (1.854 m)  Weight: 204 lb (92.534 kg)    BP 170/89  Pulse 45    PHYSICAL EXAM General: NAD  Neck: No JVD, no thyromegaly or thyroid nodule.  Lungs: Clear to auscultation bilaterally with normal respiratory effort.  CV: Nondisplaced PMI. Heart irregular rhythm but rate is controlled, normal S1/S2, no S3/S4, no murmur. 1+ peripheral edema of left leg. No carotid bruit. Absent pedal pulses on left.  Abdomen: Soft, nontender, no hepatosplenomegaly, no distention.  Neurologic: Alert and oriented x 3.  Psych: Normal affect.  Extremities: No clubbing or cyanosis. Left leg with compression stocking and 1+ pitting edema.   ECG: reviewed and available in electronic records.      ASSESSMENT AND PLAN: 1. Atrial fibrillation: Permanent. He is not on anticoagulation since he had life-threatening GI bleeding on 06/25/12. No definite bleeding source was identified. I will continue to leave him off anticoagulation given this history. He is bradycardic today (HR 45 bpm), thus I will reduce metoprolol to 12.5 mg bid.  2. CAD: History of CABG. No recent chest pain or significant dyspnea. Continue ASA, Plavix, and statin.  3. Hyperlipidemia: Monitored by PCP. Continue Simvastatin 40 mg daily.  4. HTN: BP elevated today. Will increase amlodipine to 10 mg daily for now, and reduce metoprolol as mentioned above.  5. Chronic diastolic CHF: I've told him to take an extra dose of Lasix for increased leg swelling as needed. Continue use of compression stocking. His LV systolic function is vigorous.  6. PAD: Managed by Dr. Kirke CorinArida.  7. Phantom limb pain: At right BKA site, and has received injections and lidoderm patches. Unable to tolerate amitriptyline. 8. CKD: stable.  Dispo: f/u 6 months.  Prentice DockerSuresh Tome Wilson, M.D., F.A.C.C.

## 2013-03-28 NOTE — Patient Instructions (Signed)
   Increase Amlodipine to 10mg  daily - new sent to pharm (may take 2 of the 5mg  tabs till finish current supply)  Change Clonidine to 0.1mg  tablet twice a day - new sent to pharm  Decrease Metoprolol to 12.5mg  twice a day  (may break the 25mg  tablet in half) Continue all other medications.   Your physician wants you to follow up in: 6 months.  You will receive a reminder letter in the mail one-two months in advance.  If you don't receive a letter, please call our office to schedule the follow up appointment

## 2013-05-26 ENCOUNTER — Other Ambulatory Visit: Payer: Self-pay | Admitting: Cardiovascular Disease

## 2013-06-21 ENCOUNTER — Ambulatory Visit (INDEPENDENT_AMBULATORY_CARE_PROVIDER_SITE_OTHER): Payer: Medicare Other | Admitting: Cardiovascular Disease

## 2013-06-21 ENCOUNTER — Encounter: Payer: Self-pay | Admitting: Cardiovascular Disease

## 2013-06-21 VITALS — BP 164/74 | HR 59 | Ht 73.0 in | Wt 191.8 lb

## 2013-06-21 DIAGNOSIS — I739 Peripheral vascular disease, unspecified: Secondary | ICD-10-CM

## 2013-06-21 DIAGNOSIS — E785 Hyperlipidemia, unspecified: Secondary | ICD-10-CM

## 2013-06-21 LAB — BASIC METABOLIC PANEL
BUN: 37 mg/dL — ABNORMAL HIGH (ref 6–23)
CO2: 28 mEq/L (ref 19–32)
Calcium: 9.4 mg/dL (ref 8.4–10.5)
Chloride: 101 mEq/L (ref 96–112)
Creatinine, Ser: 1.8 mg/dL — ABNORMAL HIGH (ref 0.4–1.5)
GFR: 38.09 mL/min — AB (ref >60.00)
Glucose, Bld: 113 mg/dL — ABNORMAL HIGH (ref 70–99)
POTASSIUM: 4.2 meq/L (ref 3.5–5.1)
SODIUM: 138 meq/L (ref 135–145)

## 2013-06-21 LAB — CBC
HCT: 38.4 % — ABNORMAL LOW (ref 39.0–52.0)
HEMOGLOBIN: 12.6 g/dL — AB (ref 13.0–17.0)
MCHC: 32.8 g/dL (ref 30.0–36.0)
MCV: 91.2 fl (ref 78.0–100.0)
PLATELETS: 312 10*3/uL (ref 150.0–400.0)
RBC: 4.21 Mil/uL — AB (ref 4.22–5.81)
RDW: 14.9 % (ref 11.5–15.5)
WBC: 14.1 10*3/uL — AB (ref 4.0–10.5)

## 2013-06-21 LAB — PROTIME-INR
INR: 1 ratio (ref 0.8–1.0)
Prothrombin Time: 11.2 s (ref 9.6–13.1)

## 2013-06-21 NOTE — Patient Instructions (Signed)
Your physician has requested that you have a peripheral vascular angiogram. This exam is performed at the hospital. During this exam IV contrast is used to look at arterial blood flow. Please review the information sheet given for details.  Your physician recommends that you have lab work today: BMP, CBC and PT/INR  Your physician recommends that you continue on your current medications as directed. Please refer to the Current Medication list given to you today.  

## 2013-06-21 NOTE — Progress Notes (Signed)
HPI  This is an 80 yo male with history of CAD s/p CABG, PAD, permanent atrial fibrillation, and diastolic CHF who is here today for a follow up visit regarding peripheral arterial disease. He had right BKA in 2010 due to failed right fem-pop bypass and now walks with a walker on a prosthetic leg. He had severe phantom limb pain since then.  He had severe GI bleeding requiring 3 units PRBCs in 6/13. Xarelto was stopped. A bleeding source was not identified. He does have advanced chronic kidney disease.  He was seen in July, 2014 for slow healing ulcers on left leg.  ABI  was 0.45 on the left with evidence of multilevel disease. I proceeded with angiography which showed moderate left iliac artery disease, severe 90% ostial left SFA stenosis at the origin of the profunda , and occluded left popliteal artery with reconstitution via collaterals in the peroneal artery which was  the only patent vessel below the knee. I consulted Dr. Hart Rochester to consider femoral-peroneal bypass. However, the patient was found to have no venous conduits. He was started on amitriptyline for Phantom pain on the right side with significant improvement in symptoms. However, this was discontinued due to side effects.  Initially, the wounds in the left leg healed. However, he underwent recent callous and toe nail trimming. Since then, he had nonhealing ulcer on the left fourth toe with severe pain in his toes at rest mostly at night.  Allergies  Allergen Reactions  . Ace Inhibitors     REACTION: angioedema  . Amitriptyline     Comatose symptoms, per pt's daughter.    . Angiotensin Receptor Blockers     REACTION: angioedema  . Cymbalta [Duloxetine Hcl] Other (See Comments)    Abnormal behavior  . Lisinopril Other (See Comments)    Abnormal behavior  . Vicodin [Hydrocodone-Acetaminophen]     Comatose symptoms, per pt's daughter.     Current Outpatient Prescriptions on File Prior to Visit  Medication Sig Dispense  Refill  . amLODipine (NORVASC) 10 MG tablet Take 1 tablet (10 mg total) by mouth daily.  30 tablet  6  . aspirin EC 81 MG tablet Take 1 tablet (81 mg total) by mouth daily.      . Calcium Carbonate-Vitamin D (CALCIUM + D PO) Take 1 tablet by mouth 2 (two) times daily.      . cloNIDine (CATAPRES) 0.1 MG tablet Take 1 tablet (0.1 mg total) by mouth 2 (two) times daily.  60 tablet  6  . clopidogrel (PLAVIX) 75 MG tablet Take 75 mg by mouth every morning.      . ferrous sulfate 325 (65 FE) MG tablet Take 325 mg by mouth daily with breakfast.      . fish oil-omega-3 fatty acids 1000 MG capsule Take 1 g by mouth daily.       . furosemide (LASIX) 40 MG tablet Take 40 mg by mouth daily.       Marland Kitchen glimepiride (AMARYL) 2 MG tablet Take 2 mg by mouth daily before breakfast.        . lidocaine (LIDODERM) 5 % Place 1 patch onto the skin every 12 (twelve) hours as needed. Remove & Discard patch within 12 hours or as directed by MD      . metoprolol tartrate (LOPRESSOR) 25 MG tablet Take 0.5 tablets (12.5 mg total) by mouth 2 (two) times daily.      . metoprolol tartrate (LOPRESSOR) 25 MG tablet Take 0.5 tablets (  12.5 mg total) by mouth 2 (two) times daily.  30 tablet  6  . pantoprazole (PROTONIX) 40 MG tablet Take 1 tablet (40 mg total) by mouth daily.  30 tablet  11  . potassium chloride (K-DUR) 10 MEQ tablet Take 1 tablet by mouth daily.      . simvastatin (ZOCOR) 40 MG tablet Take 40 mg by mouth at bedtime.        . traMADol (ULTRAM) 50 MG tablet 50 mg daily. 1/2 tab in the am, 1/2 in 8 hrs, 1 tablet at bedtime.      . vitamin C (ASCORBIC ACID) 500 MG tablet Take 500 mg by mouth daily.       No current facility-administered medications on file prior to visit.     Past Medical History  Diagnosis Date  . Coronary artery disease     Status post coronary bypass grafting February 2009  . Dyslipidemia   . Hypertension   . Renal insufficiency     GFR 43 meals per minute April 2012  . Hypoalbuminemia   .  Obesity     lungs pending essential hypertension  . Peripheral vascular disease     a. s/p prior R fem-pop followed by BKA;  b. 09/2012 Periph Angio: L RAS, no signif ao/il dzs, sev ostial LSFA stenosis, 100 l pop w reconst in peroneal which is only patent vessel BK -->Seen by Vasc Surg- poor surg candidate.  . Phantom limb pain   . Anginal pain   . Arrhythmia     post op atrial fibrillation,treated with amiodarone  . Atrial fibrillation     a. off anticoagulation 2/2 gib on xarelto  . NSTEMI (non-ST elevated myocardial infarction) 02/2007    "during operation" (09/15/2012)  . Exertional shortness of breath     "at my age": (09/15/2012)  . Type II diabetes mellitus   . History of blood transfusion     "after they put him on blood thinner; got 3 units; stopped RX; hasn't had problem since" (09/15/2012)  . GERD (gastroesophageal reflux disease)     "use to have it real bad; no problem lately" (09/15/2012)  . Arthritis     "legs and arms" (09/15/2012)     Past Surgical History  Procedure Laterality Date  . Cholecystectomy    . Abdominal aortagram  09/15/2012  . Below knee leg amputation Right 02/2010  . Coronary artery bypass graft  03/10/2007    4 vessel  . Inguinal hernia repair    . Appendectomy  ~ 1938  . Total hip arthroplasty Right 2012  . Joint replacement    . Femoral bypass Right 02/04/2008    PVD Dr.Greg Burna MortimerHayes  femto pt bypass with gortex; occluded by doppler 08/2008  . Vascular surgery      PVD Dr.Greg Madilyn FiremanHayes right femto pt bypass with gortex 02/04/2008 occluded by doppler 08/2008  . Cataract extraction w/ intraocular lens  implant, bilateral Bilateral 07/2012  . Coronary angioplasty       No family history on file.   History   Social History  . Marital Status: Married    Spouse Name: N/A    Number of Children: N/A  . Years of Education: N/A   Occupational History  . 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 Smoking  status: Never Smoker   . Smokeless tobacco: Never Used  . Alcohol Use: No  . Drug Use:  No  . Sexual Activity: Yes   Other Topics Concern  . Not on file   Social History Narrative  . No narrative on file    PHYSICAL EXAM   BP 164/74  Pulse 59  Ht 6\' 1"  (1.854 m)  Wt 191 lb 12.8 oz (87 kg)  BMI 25.31 kg/m2  Constitutional: He is oriented to person, place, and time. He appears frail but well-nourished. No distress.  HENT: No nasal discharge.  Head: Normocephalic and atraumatic.  Eyes: Pupils are equal and round.  Neck: Normal range of motion. Neck supple. No JVD present. No thyromegaly present.  Cardiovascular: Normal rate, irregular rhythm, normal heart sounds and. Exam reveals no gallop and no friction rub. Pulmonary/Chest: Effort normal and breath sounds normal. No stridor. No respiratory distress. He has no wheezes. He has no rales. He exhibits no tenderness.  Abdominal: Soft. Bowel sounds are normal. He exhibits no distension. There is no tenderness. There is no rebound and no guarding.  Musculoskeletal: Normal range of motion. He exhibits +1 edema and no tenderness.  Neurological: He is alert and oriented to person, place, and time. Coordination normal.  Skin: Skin is warm and dry. Chronic stasis dermatitis. He is not diaphoretic. No erythema. No pallor.  Psychiatric: He has a normal mood and affect. His behavior is normal. Judgment and thought content normal.  Vascular: Femoral pulse: normal on the right side and slightly diminished on the left side . Marland Kitchen. Left popliteal pulses not palpable. Distal pulses are not palpable. There is dependent rubor. There is small ulceration on the left fourth toe   EKG: Atrial fibrillation with old septal infarct.  ASSESSMENT AND PLAN

## 2013-06-21 NOTE — Assessment & Plan Note (Signed)
Unfortunately, [name] Love now has critical limb ischemia with ulceration on the left fourth toe and severe rest pain mostly at night. He has multiple comorbidities with advanced chronic kidney disease. He has significant ostial left SFA stenosis and occluded popliteal artery with one-vessel runoff below the knee. He was deemed to be not a candidate for lower extremity bypass due to lack of venous conduits. Due to all of that, I recommend proceeding with attempt of endovascular intervention. Atherectomy on the ostial left SFA will be considered with attempting to cross the occlusion in the left popliteal artery. He does have chronic kidney disease and the plan is to bring him early for hydration. Patient and family understand the risks especially contrast-induced nephropathy. This patient is at significant risk of limb loss and amputation without revascularization.

## 2013-06-27 MED ORDER — LIDOCAINE HCL (PF) 1 % IJ SOLN
INTRAMUSCULAR | Status: AC
  Filled 2013-06-27: qty 30

## 2013-06-27 MED ORDER — NITROGLYCERIN 0.2 MG/ML ON CALL CATH LAB
INTRAVENOUS | Status: AC
  Filled 2013-06-27: qty 1

## 2013-06-27 MED ORDER — HEPARIN (PORCINE) IN NACL 2-0.9 UNIT/ML-% IJ SOLN
INTRAMUSCULAR | Status: AC
  Filled 2013-06-27: qty 1000

## 2013-06-28 ENCOUNTER — Encounter (HOSPITAL_COMMUNITY): Payer: Self-pay | Admitting: Pharmacy Technician

## 2013-06-29 ENCOUNTER — Ambulatory Visit (HOSPITAL_COMMUNITY)
Admission: RE | Admit: 2013-06-29 | Discharge: 2013-06-29 | Disposition: A | Discharge status: Home / Self Care | Payer: Medicare Other | Source: Ambulatory Visit | Attending: Cardiovascular Disease | Admitting: Cardiovascular Disease

## 2013-06-29 ENCOUNTER — Encounter (HOSPITAL_COMMUNITY)
Admission: RE | Disposition: A | Discharge status: Home / Self Care | Payer: Self-pay | Source: Ambulatory Visit | Attending: Cardiovascular Disease

## 2013-06-29 DIAGNOSIS — I739 Peripheral vascular disease, unspecified: Secondary | ICD-10-CM | POA: Insufficient documentation

## 2013-06-29 DIAGNOSIS — I252 Old myocardial infarction: Secondary | ICD-10-CM | POA: Insufficient documentation

## 2013-06-29 DIAGNOSIS — I5032 Chronic diastolic (congestive) heart failure: Secondary | ICD-10-CM | POA: Insufficient documentation

## 2013-06-29 DIAGNOSIS — I2510 Atherosclerotic heart disease of native coronary artery without angina pectoris: Secondary | ICD-10-CM | POA: Insufficient documentation

## 2013-06-29 DIAGNOSIS — Z951 Presence of aortocoronary bypass graft: Secondary | ICD-10-CM | POA: Insufficient documentation

## 2013-06-29 DIAGNOSIS — I708 Atherosclerosis of other arteries: Secondary | ICD-10-CM | POA: Insufficient documentation

## 2013-06-29 DIAGNOSIS — Z96649 Presence of unspecified artificial hip joint: Secondary | ICD-10-CM | POA: Insufficient documentation

## 2013-06-29 DIAGNOSIS — I70219 Atherosclerosis of native arteries of extremities with intermittent claudication, unspecified extremity: Secondary | ICD-10-CM

## 2013-06-29 DIAGNOSIS — L97509 Non-pressure chronic ulcer of other part of unspecified foot with unspecified severity: Secondary | ICD-10-CM | POA: Insufficient documentation

## 2013-06-29 DIAGNOSIS — S88119A Complete traumatic amputation at level between knee and ankle, unspecified lower leg, initial encounter: Secondary | ICD-10-CM | POA: Insufficient documentation

## 2013-06-29 DIAGNOSIS — I4891 Unspecified atrial fibrillation: Secondary | ICD-10-CM | POA: Insufficient documentation

## 2013-06-29 DIAGNOSIS — L98499 Non-pressure chronic ulcer of skin of other sites with unspecified severity: Principal | ICD-10-CM | POA: Insufficient documentation

## 2013-06-29 DIAGNOSIS — Z7902 Long term (current) use of antithrombotics/antiplatelets: Secondary | ICD-10-CM | POA: Insufficient documentation

## 2013-06-29 DIAGNOSIS — G547 Phantom limb syndrome without pain: Secondary | ICD-10-CM | POA: Insufficient documentation

## 2013-06-29 DIAGNOSIS — M129 Arthropathy, unspecified: Secondary | ICD-10-CM | POA: Insufficient documentation

## 2013-06-29 DIAGNOSIS — E669 Obesity, unspecified: Secondary | ICD-10-CM | POA: Insufficient documentation

## 2013-06-29 DIAGNOSIS — E785 Hyperlipidemia, unspecified: Secondary | ICD-10-CM | POA: Insufficient documentation

## 2013-06-29 DIAGNOSIS — K219 Gastro-esophageal reflux disease without esophagitis: Secondary | ICD-10-CM | POA: Insufficient documentation

## 2013-06-29 DIAGNOSIS — Z5309 Procedure and treatment not carried out because of other contraindication: Secondary | ICD-10-CM | POA: Insufficient documentation

## 2013-06-29 DIAGNOSIS — E119 Type 2 diabetes mellitus without complications: Secondary | ICD-10-CM | POA: Insufficient documentation

## 2013-06-29 DIAGNOSIS — N189 Chronic kidney disease, unspecified: Secondary | ICD-10-CM | POA: Insufficient documentation

## 2013-06-29 DIAGNOSIS — Z7982 Long term (current) use of aspirin: Secondary | ICD-10-CM | POA: Insufficient documentation

## 2013-06-29 DIAGNOSIS — I129 Hypertensive chronic kidney disease with stage 1 through stage 4 chronic kidney disease, or unspecified chronic kidney disease: Secondary | ICD-10-CM | POA: Insufficient documentation

## 2013-06-29 HISTORY — PX: LOWER EXTREMITY ANGIOGRAM: SHX5508

## 2013-06-29 LAB — CBC
HEMATOCRIT: 35.8 % — AB (ref 39.0–52.0)
HEMATOCRIT: 37.8 % — AB (ref 39.0–52.0)
Hemoglobin: 11.8 g/dL — ABNORMAL LOW (ref 13.0–17.0)
Hemoglobin: 12.6 g/dL — ABNORMAL LOW (ref 13.0–17.0)
MCH: 30.7 pg (ref 26.0–34.0)
MCH: 30.6 pg (ref 26.0–34.0)
MCHC: 33 g/dL (ref 30.0–36.0)
MCHC: 33.3 g/dL (ref 30.0–36.0)
MCV: 93.2 fL (ref 78.0–100.0)
MCV: 91.7 fL (ref 78.0–100.0)
PLATELETS: 215 10*3/uL (ref 150–400)
PLATELETS: 280 10*3/uL (ref 150–400)
RBC: 3.84 MIL/uL — ABNORMAL LOW (ref 4.22–5.81)
RBC: 4.12 MIL/uL — ABNORMAL LOW (ref 4.22–5.81)
RDW: 14.4 % (ref 11.5–15.5)
RDW: 14.4 % (ref 11.5–15.5)
WBC: 9.4 10*3/uL (ref 4.0–10.5)
WBC: 10.5 10*3/uL (ref 4.0–10.5)

## 2013-06-29 LAB — PROTIME-INR
INR: 1.09 (ref 0.00–1.49)
Prothrombin Time: 13.9 seconds (ref 11.6–15.2)

## 2013-06-29 LAB — BASIC METABOLIC PANEL
BUN: 28 mg/dL — ABNORMAL HIGH (ref 6–23)
CALCIUM: 8.7 mg/dL (ref 8.4–10.5)
CO2: 25 meq/L (ref 19–32)
Chloride: 101 mEq/L (ref 96–112)
Creatinine, Ser: 1.56 mg/dL — ABNORMAL HIGH (ref 0.50–1.35)
GFR calc Af Amer: 46 mL/min — ABNORMAL LOW (ref >90)
GFR, EST NON AFRICAN AMERICAN: 40 mL/min — AB (ref >90)
Glucose, Bld: 104 mg/dL — ABNORMAL HIGH (ref 70–99)
Potassium: 4 mEq/L (ref 3.7–5.3)
SODIUM: 139 meq/L (ref 137–147)

## 2013-06-29 LAB — GLUCOSE, CAPILLARY
GLUCOSE-CAPILLARY: 110 mg/dL — AB (ref 70–99)
Glucose-Capillary: 103 mg/dL — ABNORMAL HIGH (ref 70–99)

## 2013-06-29 SURGERY — ANGIOGRAM, LOWER EXTREMITY
Anesthesia: LOCAL | Laterality: Left

## 2013-06-29 MED ORDER — SODIUM CHLORIDE 0.9 % IJ SOLN: 3.0000 mL | Freq: Two times a day (BID) | INTRAMUSCULAR | Status: DC

## 2013-06-29 MED ORDER — HEPARIN (PORCINE) IN NACL 2-0.9 UNIT/ML-% IJ SOLN
INTRAMUSCULAR | Status: AC
  Filled 2013-06-29: qty 1000

## 2013-06-29 MED ORDER — LIDOCAINE HCL (PF) 1 % IJ SOLN
INTRAMUSCULAR | Status: AC
  Filled 2013-06-29: qty 30

## 2013-06-29 MED ORDER — FENTANYL CITRATE 0.05 MG/ML IJ SOLN
INTRAMUSCULAR | Status: AC
  Filled 2013-06-29: qty 2

## 2013-06-29 MED ORDER — MIDAZOLAM HCL 2 MG/2ML IJ SOLN
INTRAMUSCULAR | Status: AC
  Filled 2013-06-29: qty 2

## 2013-06-29 MED ORDER — SODIUM CHLORIDE 0.9 % IV SOLN: 250.0000 mL | INTRAVENOUS | Status: DC | PRN

## 2013-06-29 MED ORDER — HYDRALAZINE HCL 20 MG/ML IJ SOLN
INTRAMUSCULAR | Status: AC
  Filled 2013-06-29: qty 1

## 2013-06-29 MED ORDER — SODIUM CHLORIDE 0.9 % IJ SOLN: 3.0000 mL | INTRAMUSCULAR | Status: DC | PRN

## 2013-06-29 MED ORDER — SODIUM CHLORIDE 0.9 % IV SOLN
INTRAVENOUS | Status: DC
  Administered 2013-06-29: 07:00:00 via INTRAVENOUS

## 2013-06-29 MED ORDER — ASPIRIN 81 MG PO CHEW: 81.0000 mg | CHEWABLE_TABLET | ORAL | Status: DC

## 2013-06-29 MED ORDER — SODIUM CHLORIDE 0.9 % IV SOLN: INTRAVENOUS | Status: DC

## 2013-06-29 NOTE — CV Procedure (Signed)
PERIPHERAL VASCULAR PROCEDURE  NAME:  [name] Love   MRN: [id number] DOB:  [birth year]   ADMIT DATE: 06/29/2013  Performing Cardiologist: Lorine BearsMuhammad Tihanna Goodson Primary Physician: Ignatius SpeckingVYAS,DHRUV B., MD Primary Cardiologist:  Dr. Purvis SheffieldKoneswaran.   Procedures Performed:  Bilateral iliac angiography with CO2   Second Order left Lower Extremity Angiography with Runoff  Third order catheter placement in left popliteal artery.    attempted unsuccessful angioplasty of the left popliteal artery occlusion.  Mynx closure device   Indication(s):    Critical Limb Ischemia   Consent: The procedure with Risks/Benefits/Alternatives and Indications was reviewed with the patient .  All questions were answered.  Medications:  Sedation:  1 mg IV Versed, 25 mcg IV Fentanyl  Contrast:  59 ml   Visipaque   Procedural details: The right groin was prepped, draped, and anesthetized with 1% lidocaine. Using modified Seldinger technique, a 5 French sheath was introduced into the right common femoral artery. A 5 Fr Short Pigtail Catheter was advanced of over a  Versicore wire into the descending Aorta above the iliac bifurcation. Bilateral iliac angiography was performed with DSA and manual CO2 injection.   The pigtail catheter was changed over the Versicore wire for A crossover catheter which was then pulled back the aortic bifurcation and the glide wire was advanced down the contralateral common iliac artery.  The wire was then advanced to the contralateral common femoral artery, the catheter was exchanged into an end hole straight tip catheter which was advanced over the wire to the common femoral artery. Contralateral second-order lower extremity angiography was performed via power injection of 5 ml / sec contrast for a total of 35 ml.    Interventional Procedure:  [facility] and was randomized either to heparin or bivalirudin. A 6 French 45 cm destination sheath  was advanced with the tip placed in the left common femoral artery. The study drug was started and ACT was checked. I then used a Reglia wire over an 014 CXI catheter to try to cross the occlusion in the popliteal artery. I was able to advance for only a few centimeters and then was subintimal. I exchanged the wire to an Astato 20 wire and spent about 15 minutes trying to cross the occlusion with no success. Given the significant length of the occlusion, I felt that the chance of success from antegrade is likely low thus I decided to abort the procedure.  Hemostasis was achieved with a Mynx closure device. The patient tolerated the procedure well with no immediate complications.   Hemodynamics:  Central Aortic Pressure / Mean Aortic Pressure: 200/80   Findings:  Abdominal aorta: The distal aorta is free of significant disease or aneurysm.   Right common iliac artery: Minor irregularities   Right internal iliac artery: Not well visualized   Right external iliac artery: Minor irregularities.   Left common iliac artery:  There is a 40% mid stenosis.   Left internal iliac artery: Patent   Left external iliac artery: 20% proximal stenosis   Left common femoral artery: Minor irregularities.   Left profunda femoral artery: Normal and provides collaterals.   Left superficial femoral artery:  On previous study, there was significant ostial stenosis. It doesn't seem to be a significant on current study. The proximal and mid SFA has diffuse 30-40% disease.   Left popliteal artery: Occluded proximally with reconstitution distally in the posterior tibial and peroneal artery. The anterior tibial artery is likely occluded.  There is 2 vessel runoff distally. Anterior tibial isn't visualized and appears to have some flow although it stopped above the foot.    Conclusions: 1. Long occlusion of the left popliteal artery with reconstitution via faint collaterals in the posterior tibial and peroneal  artery.   2. Unsuccessful left popliteal artery angioplasty due to inability to cross the occlusion antegrade.  Recommendations:  Recommend attempted retrograde angioplasty. If unsuccessful, the patient might ultimately require amputation. I will discuss the case with Dr. Hoy FinlayGeorge Adams.    Lorine BearsMuhammad Kerrion Kemppainen, MD, [facility] 06/29/2013 2:25 PM

## 2013-06-29 NOTE — H&P (View-Only) (Signed)
HPI  This is an 80 yo male with history of CAD s/p CABG, PAD, permanent atrial fibrillation, and diastolic CHF who is here today for a follow up visit regarding peripheral arterial disease. He had right BKA in 2010 due to failed right fem-pop bypass and now walks with a walker on a prosthetic leg. He had severe phantom limb pain since then.  He had severe GI bleeding requiring 3 units PRBCs in 6/13. Xarelto was stopped. A bleeding source was not identified. He does have advanced chronic kidney disease.  He was seen in July, 2014 for slow healing ulcers on left leg.  ABI  was 0.45 on the left with evidence of multilevel disease. I proceeded with angiography which showed moderate left iliac artery disease, severe 90% ostial left SFA stenosis at the origin of the profunda , and occluded left popliteal artery with reconstitution via collaterals in the peroneal artery which was  the only patent vessel below the knee. I consulted Dr. Hart Rochester to consider femoral-peroneal bypass. However, the patient was found to have no venous conduits. He was started on amitriptyline for Phantom pain on the right side with significant improvement in symptoms. However, this was discontinued due to side effects.  Initially, the wounds in the left leg healed. However, he underwent recent callous and toe nail trimming. Since then, he had nonhealing ulcer on the left fourth toe with severe pain in his toes at rest mostly at night.  Allergies  Allergen Reactions  . Ace Inhibitors     REACTION: angioedema  . Amitriptyline     Comatose symptoms, per pt's daughter.    . Angiotensin Receptor Blockers     REACTION: angioedema  . Cymbalta [Duloxetine Hcl] Other (See Comments)    Abnormal behavior  . Lisinopril Other (See Comments)    Abnormal behavior  . Vicodin [Hydrocodone-Acetaminophen]     Comatose symptoms, per pt's daughter.     Current Outpatient Prescriptions on File Prior to Visit  Medication Sig Dispense  Refill  . amLODipine (NORVASC) 10 MG tablet Take 1 tablet (10 mg total) by mouth daily.  30 tablet  6  . aspirin EC 81 MG tablet Take 1 tablet (81 mg total) by mouth daily.      . Calcium Carbonate-Vitamin D (CALCIUM + D PO) Take 1 tablet by mouth 2 (two) times daily.      . cloNIDine (CATAPRES) 0.1 MG tablet Take 1 tablet (0.1 mg total) by mouth 2 (two) times daily.  60 tablet  6  . clopidogrel (PLAVIX) 75 MG tablet Take 75 mg by mouth every morning.      . ferrous sulfate 325 (65 FE) MG tablet Take 325 mg by mouth daily with breakfast.      . fish oil-omega-3 fatty acids 1000 MG capsule Take 1 g by mouth daily.       . furosemide (LASIX) 40 MG tablet Take 40 mg by mouth daily.       Marland Kitchen glimepiride (AMARYL) 2 MG tablet Take 2 mg by mouth daily before breakfast.        . lidocaine (LIDODERM) 5 % Place 1 patch onto the skin every 12 (twelve) hours as needed. Remove & Discard patch within 12 hours or as directed by MD      . metoprolol tartrate (LOPRESSOR) 25 MG tablet Take 0.5 tablets (12.5 mg total) by mouth 2 (two) times daily.      . metoprolol tartrate (LOPRESSOR) 25 MG tablet Take 0.5 tablets (  12.5 mg total) by mouth 2 (two) times daily.  30 tablet  6  . pantoprazole (PROTONIX) 40 MG tablet Take 1 tablet (40 mg total) by mouth daily.  30 tablet  11  . potassium chloride (K-DUR) 10 MEQ tablet Take 1 tablet by mouth daily.      . simvastatin (ZOCOR) 40 MG tablet Take 40 mg by mouth at bedtime.        . traMADol (ULTRAM) 50 MG tablet 50 mg daily. 1/2 tab in the am, 1/2 in 8 hrs, 1 tablet at bedtime.      . vitamin C (ASCORBIC ACID) 500 MG tablet Take 500 mg by mouth daily.       No current facility-administered medications on file prior to visit.     Past Medical History  Diagnosis Date  . Coronary artery disease     Status post coronary bypass grafting February 2009  . Dyslipidemia   . Hypertension   . Renal insufficiency     GFR 43 meals per minute April 2012  . Hypoalbuminemia   .  Obesity     lungs pending essential hypertension  . Peripheral vascular disease     a. s/p prior R fem-pop followed by BKA;  b. 09/2012 Periph Angio: L RAS, no signif ao/il dzs, sev ostial LSFA stenosis, 100 l pop w reconst in peroneal which is only patent vessel BK -->Seen by Vasc Surg- poor surg candidate.  . Phantom limb pain   . Anginal pain   . Arrhythmia     post op atrial fibrillation,treated with amiodarone  . Atrial fibrillation     a. off anticoagulation 2/2 gib on xarelto  . NSTEMI (non-ST elevated myocardial infarction) 02/2007    "during operation" (09/15/2012)  . Exertional shortness of breath     "at my age": (09/15/2012)  . Type II diabetes mellitus   . History of blood transfusion     "after they put him on blood thinner; got 3 units; stopped RX; hasn't had problem since" (09/15/2012)  . GERD (gastroesophageal reflux disease)     "use to have it real bad; no problem lately" (09/15/2012)  . Arthritis     "legs and arms" (09/15/2012)     Past Surgical History  Procedure Laterality Date  . Cholecystectomy    . Abdominal aortagram  09/15/2012  . Below knee leg amputation Right 02/2010  . Coronary artery bypass graft  03/10/2007    4 vessel  . Inguinal hernia repair    . Appendectomy  ~ 1938  . Total hip arthroplasty Right 2012  . Joint replacement    . Femoral bypass Right 02/04/2008    PVD Dr.Greg Burna MortimerHayes  femto pt bypass with gortex; occluded by doppler 08/2008  . Vascular surgery      PVD Dr.Greg Madilyn FiremanHayes right femto pt bypass with gortex 02/04/2008 occluded by doppler 08/2008  . Cataract extraction w/ intraocular lens  implant, bilateral Bilateral 07/2012  . Coronary angioplasty       No family history on file.   History   Social History  . Marital Status: Married    Spouse Name: N/A    Number of Children: N/A  . Years of Education: N/A   Occupational History  . 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 Smoking  status: Never Smoker   . Smokeless tobacco: Never Used  . Alcohol Use: No  . Drug Use:  No  . Sexual Activity: Yes   Other Topics Concern  . Not on file   Social History Narrative  . No narrative on file    PHYSICAL EXAM   BP 164/74  Pulse 59  Ht 6\' 1"  (1.854 m)  Wt 191 lb 12.8 oz (87 kg)  BMI 25.31 kg/m2  Constitutional: He is oriented to person, place, and time. He appears frail but well-nourished. No distress.  HENT: No nasal discharge.  Head: Normocephalic and atraumatic.  Eyes: Pupils are equal and round.  Neck: Normal range of motion. Neck supple. No JVD present. No thyromegaly present.  Cardiovascular: Normal rate, irregular rhythm, normal heart sounds and. Exam reveals no gallop and no friction rub. Pulmonary/Chest: Effort normal and breath sounds normal. No stridor. No respiratory distress. He has no wheezes. He has no rales. He exhibits no tenderness.  Abdominal: Soft. Bowel sounds are normal. He exhibits no distension. There is no tenderness. There is no rebound and no guarding.  Musculoskeletal: Normal range of motion. He exhibits +1 edema and no tenderness.  Neurological: He is alert and oriented to person, place, and time. Coordination normal.  Skin: Skin is warm and dry. Chronic stasis dermatitis. He is not diaphoretic. No erythema. No pallor.  Psychiatric: He has a normal mood and affect. His behavior is normal. Judgment and thought content normal.  Vascular: Femoral pulse: normal on the right side and slightly diminished on the left side . Marland Kitchen. Left popliteal pulses not palpable. Distal pulses are not palpable. There is dependent rubor. There is small ulceration on the left fourth toe   EKG: Atrial fibrillation with old septal infarct.  ASSESSMENT AND PLAN

## 2013-06-29 NOTE — Discharge Instructions (Signed)
I will get in [place]ernell Dupre[place]ams.  Angiogram, Care After Refer to this sheet in the next few weeks. These instructions provide you with information on caring for yourself after your procedure. Your health care provider may also give you more specific instructions. Your treatment has been planned according to current medical practices, but problems sometimes occur. Call your health care provider if you have any problems or questions after your procedure.  WHAT TO EXPECT AFTER THE PROCEDURE After your procedure, it is typical to have the following sensations:  Minor discomfort or tenderness and a small bump at the catheter insertion site. The bump should usually decrease in size and tenderness within 1 to 2 weeks.  Any bruising will usually fade within 2 to 4 weeks. HOME CARE INSTRUCTIONS   You may need to keep taking blood thinners if they were prescribed for you. Only take over-the-counter or prescription medicines for pain, fever, or discomfort as directed by your health care provider.  Do not apply powder or lotion to the site.  Do not sit in a bathtub, swimming pool, or whirlpool for 5 to 7 days.  You may shower 24 hours after the procedure. Remove the bandage (dressing) and gently wash the site with plain soap and water. Gently pat the site dry.  Inspect the site at least twice daily.  Limit your activity for the first 48 hours. Do not bend, squat, or lift anything over 20 lb (9 kg) or as directed by your health care provider.  Do not drive home if you are discharged the day of the procedure. Have someone else drive you. Follow instructions about when you can drive or return to work. SEEK MEDICAL CARE IF:  You get lightheaded when standing up.  You have drainage (other than a small amount of blood on the dressing).  You have chills.  You have a fever.  You have redness, warmth, swelling, or pain at the insertion site. SEEK  IMMEDIATE MEDICAL CARE IF:   You develop chest pain or shortness of breath, feel faint, or pass out.  You have bleeding, swelling larger than a walnut, or drainage from the catheter insertion site.  You develop pain, discoloration, coldness, or severe bruising in the leg or arm that held the catheter.  You develop bleeding from any other place, such as the bowels. You may see bright red blood in your urine or stools, or your stools may appear black and tarry.  You have heavy bleeding from the site. If this happens, hold pressure on the site. MAKE SURE YOU:  Understand these instructions.  Will watch your condition.  Will get help right away if you are not doing well or get worse. Document Released: 07/18/2004 Document Revised: 01/04/2013 Document Reviewed: 05/24/2012 [facility] Patient Information 2015 [employer] [place]. This information is not intended to replace advice given to you by your health care provider. Make sure you discuss any questions you have with your health care provider.

## 2013-06-29 NOTE — Interval H&P Note (Signed)
History and Physical Interval Note:  06/29/2013 1:01 PM  [name] Love  has presented today for surgery, with the diagnosis of pad  The various methods of treatment have been discussed with the patient and family. After consideration of risks, benefits and other options for treatment, the patient has consented to  Procedure(s): LOWER EXTREMITY ANGIOGRAM (Left) as a surgical intervention .  The patient's history has been reviewed, patient examined, no change in status, stable for surgery.  I have reviewed the patient's chart and labs.  Questions were answered to the patient's satisfaction.     Lorine BearsMuhammad Arida

## 2013-06-29 NOTE — Research (Signed)
[facility] Informed Consent   Subject Name: [name] Love  Subject met inclusion and exclusion criteria.  The informed consent form, study requirements and expectations were reviewed with the subject and questions and concerns were addressed prior to the signing of the consent form.  The subject verbalized understanding of the trail requirements.  The subject agreed to participate in the [facility] trial and signed the informed consent on 06/29/2013 at 0912.  The informed consent was obtained prior to performance of any protocol-specific procedures for the subject.  A copy of the signed informed consent was given to the subject and a copy was placed in the subject's medical record.  Blossom Hoops 06/29/2013, 9:45 AM

## 2013-07-07 ENCOUNTER — Telehealth: Payer: Self-pay | Admitting: Cardiovascular Disease

## 2013-07-07 NOTE — Telephone Encounter (Signed)
PT'S  DAUGHTER CALLED  WAS  TOLD  PT  MAY HAVE HAD BEEN  IN  A STUDY   WITH   PROCEDURE  THAT  WAS  DONE ON 6--17-15 PT  HAS  HAD  STROKE ON TUES NIGHT. WILL FROWARD TO  DR ARIDA  FOR REVIEW/CY

## 2013-07-07 NOTE — Telephone Encounter (Signed)
New problem   Pt's daughter stated pt had a stroke and [facility]. Didn't know if this would effect him with the research project. Please advise daughter.

## 2013-07-08 NOTE — Telephone Encounter (Signed)
Does this need to be reported as an end point? He was enrolled in [employer] on the 17th and had a stroke on the 23rd ?

## 2013-07-13 NOTE — Telephone Encounter (Signed)
The stroke happened 6 days after the procedure and was not related to study drug. The patient has known A-fib with inability to coagulate. A-fib is the likely culprit.

## 2013-07-26 ENCOUNTER — Ambulatory Visit: Payer: Medicare Other | Admitting: Cardiovascular Disease

## 2013-08-10 ENCOUNTER — Other Ambulatory Visit: Payer: Self-pay | Admitting: Cardiovascular Disease

## 2013-09-01 ENCOUNTER — Telehealth: Payer: Self-pay

## 2013-09-01 NOTE — Telephone Encounter (Signed)
[facility] [place] [facility]

## 2013-09-13 DEATH — deceased

## 2013-12-22 ENCOUNTER — Encounter (HOSPITAL_COMMUNITY): Payer: Self-pay | Admitting: Cardiovascular Disease
